# Patient Record
Sex: Female | Born: 1995 | Race: Black or African American | Hispanic: No | Marital: Single | Smoking: Never smoker
Health system: Southern US, Community
[De-identification: ages and names within clinical notes are randomized; demographics above are authoritative.]

## PROBLEM LIST (undated history)

## (undated) DIAGNOSIS — Z789 Other specified health status: Secondary | ICD-10-CM

## (undated) HISTORY — DX: Other specified health status: Z78.9

## (undated) HISTORY — PX: NO PAST SURGERIES: SHX2092

---
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
## 2014-01-04 ENCOUNTER — Other Ambulatory Visit: Payer: Self-pay | Admitting: *Deleted

## 2014-01-04 DIAGNOSIS — R7989 Other specified abnormal findings of blood chemistry: Secondary | ICD-10-CM

## 2014-01-04 DIAGNOSIS — R945 Abnormal results of liver function studies: Principal | ICD-10-CM

## 2014-01-07 ENCOUNTER — Encounter: Payer: Self-pay | Admitting: Family Medicine

## 2014-01-07 ENCOUNTER — Ambulatory Visit (INDEPENDENT_AMBULATORY_CARE_PROVIDER_SITE_OTHER): Admitting: Family Medicine

## 2014-01-07 VITALS — BP 118/60 | HR 68 | Temp 98.2°F | Resp 14 | Ht 64.0 in | Wt 197.0 lb

## 2014-01-07 DIAGNOSIS — E669 Obesity, unspecified: Secondary | ICD-10-CM | POA: Diagnosis not present

## 2014-01-07 DIAGNOSIS — R7989 Other specified abnormal findings of blood chemistry: Secondary | ICD-10-CM

## 2014-01-07 DIAGNOSIS — R945 Abnormal results of liver function studies: Principal | ICD-10-CM

## 2014-01-07 NOTE — Patient Instructions (Signed)
You had 1 liver function that was a little high , nothing of concern at this time Eat healthier , less fast food and fried foods Do not take a lot of tylenol  November 2nd for lab only

## 2014-01-07 NOTE — Progress Notes (Signed)
Patient ID: Hannah Campbell, female   DOB: 04/22/1995, 18 y.o.   MRN: [id number]   Subjective:    Patient ID: Hannah Campbell, female    DOB: 12/23/1995, 18 y.o.   MRN: [id number]  Patient presents for F/U Labs  patient here as she was concerned about her lab results. She was seen for her Depo-Provera injection on October 6 check baseline labs done which showed a minimal elevation in her AST to 64. She's not been taking excessive amounts of Tylenol she denies any drug or alcohol abuse. She is sexually active. She was told to return in 4 weeks for repeat on her liver function but she was confused and came in today    Review Of Systems:  GEN- denies fatigue, fever, weight loss,weakness, recent illness HEENT- denies eye drainage, change in vision, nasal discharge, CVS- denies chest pain, palpitations RESP- denies SOB, cough, wheeze ABD- denies N/V, change in stools, abd pain GU- denies dysuria, hematuria, dribbling, incontinence MSK- denies joint pain, muscle aches, injury Neuro- denies headache, dizziness, syncope, seizure activity       Objective:    BP 118/60  Pulse 68  Temp(Src) 98.2 F (36.8 C) (Oral)  Resp 14  Ht 5\' 4"  (1.626 m)  Wt 197 lb (89.359 kg)  BMI 33.80 kg/m2 GEN- NAD, alert and oriented x3 ABD-NABS,soft,NT,ND, NO hsm        Assessment & Plan:      Problem List Items Addressed This Visit   None    Visit Diagnoses   Elevated LFTs    -  Primary    I discussed lab results with her, discussed reasons for elevated LFT, infection, fatty liver, medications. Avoid tylenol, no ETOH, work on dietary changes,        Note: This dictation was prepared with Nurse, children'sDragon dictation along with smaller Lobbyistphrase technology. Any transcriptional errors that result from this process are unintentional.

## 2014-01-27 ENCOUNTER — Other Ambulatory Visit

## 2014-01-27 DIAGNOSIS — R7989 Other specified abnormal findings of blood chemistry: Secondary | ICD-10-CM

## 2014-01-27 DIAGNOSIS — E669 Obesity, unspecified: Secondary | ICD-10-CM

## 2014-01-27 DIAGNOSIS — R945 Abnormal results of liver function studies: Principal | ICD-10-CM

## 2014-01-28 LAB — HEPATIC FUNCTION PANEL
ALK PHOS: 64 U/L (ref 39–117)
ALT: 12 U/L (ref 0–35)
AST: 47 U/L — AB (ref 0–37)
Albumin: 4.7 g/dL (ref 3.5–5.2)
BILIRUBIN DIRECT: 0.1 mg/dL (ref 0.0–0.3)
BILIRUBIN TOTAL: 0.3 mg/dL (ref 0.2–1.1)
Indirect Bilirubin: 0.2 mg/dL (ref 0.2–1.1)
Total Protein: 7.4 g/dL (ref 6.0–8.3)

## 2014-04-03 ENCOUNTER — Ambulatory Visit (INDEPENDENT_AMBULATORY_CARE_PROVIDER_SITE_OTHER): Payer: Managed Care, Other (non HMO) | Admitting: *Deleted

## 2014-04-03 DIAGNOSIS — Z3040 Encounter for surveillance of contraceptives, unspecified: Secondary | ICD-10-CM | POA: Diagnosis not present

## 2014-04-03 MED ORDER — MEDROXYPROGESTERONE ACETATE 150 MG/ML IM SUSP
150.0000 mg | Freq: Once | INTRAMUSCULAR | Status: AC
  Administered 2014-04-03: 150 mg via INTRAMUSCULAR

## 2014-04-03 NOTE — Progress Notes (Signed)
Patient ID: Hannah Campbell, female   DOB: 11/09/1995, 19 y.o.   MRN: [id number] Patient seen in office for contraception management.   Tolerated IM injection well.

## 2014-05-13 ENCOUNTER — Encounter: Payer: Self-pay | Admitting: Physician Assistant

## 2014-08-13 ENCOUNTER — Telehealth: Payer: Self-pay | Admitting: *Deleted

## 2014-08-13 ENCOUNTER — Encounter: Payer: Self-pay | Admitting: *Deleted

## 2014-08-13 NOTE — Telephone Encounter (Signed)
Received fax from [facility].   Reports that patient received Depo injection on 07/08/2014.

## 2014-10-02 ENCOUNTER — Telehealth: Payer: Self-pay | Admitting: Physician Assistant

## 2014-10-02 ENCOUNTER — Encounter: Payer: Managed Care, Other (non HMO) | Admitting: Physician Assistant

## 2014-10-02 NOTE — Telephone Encounter (Signed)
Cancel.

## 2014-10-06 ENCOUNTER — Ambulatory Visit (INDEPENDENT_AMBULATORY_CARE_PROVIDER_SITE_OTHER): Payer: Managed Care, Other (non HMO) | Admitting: Physician Assistant

## 2014-10-06 ENCOUNTER — Encounter: Payer: Self-pay | Admitting: Physician Assistant

## 2014-10-06 ENCOUNTER — Other Ambulatory Visit: Payer: Self-pay | Admitting: Family Medicine

## 2014-10-06 VITALS — BP 112/78 | HR 80 | Temp 98.1°F | Resp 20 | Wt 204.0 lb

## 2014-10-06 DIAGNOSIS — Z308 Encounter for other contraceptive management: Secondary | ICD-10-CM | POA: Diagnosis not present

## 2014-10-06 DIAGNOSIS — Z7251 High risk heterosexual behavior: Secondary | ICD-10-CM

## 2014-10-06 DIAGNOSIS — Z0000 Encounter for general adult medical examination without abnormal findings: Secondary | ICD-10-CM | POA: Diagnosis not present

## 2014-10-06 DIAGNOSIS — Z3042 Encounter for surveillance of injectable contraceptive: Secondary | ICD-10-CM | POA: Diagnosis not present

## 2014-10-06 DIAGNOSIS — Z309 Encounter for contraceptive management, unspecified: Secondary | ICD-10-CM | POA: Insufficient documentation

## 2014-10-06 MED ORDER — MEDROXYPROGESTERONE ACETATE 150 MG/ML IM SUSP
150.0000 mg | Freq: Once | INTRAMUSCULAR | Status: AC
Start: 2014-10-06 — End: 2014-10-06
  Administered 2014-10-06: 150 mg via INTRAMUSCULAR

## 2014-10-06 MED ORDER — MEDROXYPROGESTERONE ACETATE 150 MG/ML IM SUSP: 150.0000 mg | INTRAMUSCULAR | Status: DC

## 2014-10-06 NOTE — Progress Notes (Addendum)
Patient ID: Hannah Campbell MRN: [id number], DOB: 10/16/1995, 19 y.o. Date of Encounter: 10/06/2014,   Chief Complaint: Physical (CPE)  HPI: 19 y.o. y/o AA female  here for CPE.   Says she started college in [place] "Spring Semester last year". Will be returning to [place] for college soon.  Says she has thought over what Dr. Tanya NonesPickard discussed with her about adverse effects on being on DepoProvera long time.  Says she wants to get Implanon--but will do this after she goes back to [place] (going back there for school soon). Says she wants to get one more injection to hold her over until then. (Has med with her today from pharmacy so she can get injection today) Last injection was given at [facility] in [place] 07/07/2104--There is a nurse note in Epic stating that we did receive Fax of that info.  Says she is here for "physical." Also says she wants to do test test to check for HIV.  Says when she went to clinic in [place], they did test for other STDs but "could not" test for HIV. Says just wants to check HIV today. No other concerns today.   ADDENDUM---ADDED 04/06/2015: Received Records from "[facility]in [place], [place]" 05/21/14--OV--For "Breast Exam"  07/07/2014---Prg Test 07/08/14--Depo Injection 12/09/14--"Headaches, Chest Pain, Stomach Pain---all of her pains tended to begin after starting Depo shots. She says she has already read up on all the side effects and she has all of them. She has no memory of any of these complaints prior to starting the Depo injections. She is sdistressed b/c she has gained 30 lbs since starting Depo. Her stomach is bothered by spicy foods, acidic fruits, and beverages. " Lab: for H.Pylori--Negative Started on Ranitidine  However, she continued Depo Injections Next OV--12/31/14--Pregnancy Test negative---Depo Given At OV 02/17/15--Dx of Abd Pain, GERD, Obesity, IBS were added--but was continued on Depo Inj  Labs performed there: 02/17/2015: FLP:  Trig  95,  HDL--43  LDL-70                    CMET:  AST high at 71.   ALT high at 60          CBC:   Normal           TSH: Normal 12/10/14 :  H. Pylori: Negative 07/08/14---Gon/Chlam, Trich: Negative  Review of Systems: Consitutional: No fever, chills, fatigue, night sweats, lymphadenopathy. No significant/unexplained weight changes. Eyes: No visual changes, eye redness, or discharge. ENT/Mouth: No ear pain, sore throat, nasal drainage, or sinus pain. Cardiovascular: No chest pressure,heaviness, tightness or squeezing, even with exertion. No increased shortness of breath or dyspnea on exertion.No palpitations, edema, orthopnea, PND. Respiratory: No cough, hemoptysis, SOB, or wheezing. Gastrointestinal: No anorexia, dysphagia, reflux, pain, nausea, vomiting, hematemesis, diarrhea, constipation, BRBPR, or melena. Breast: No mass, nodules, bulging, or retraction. No skin changes or inflammation. No nipple discharge. No lymphadenopathy. Genitourinary: No dysuria, hematuria, incontinence, vaginal discharge, pruritis, burning, abnormal bleeding, or pain. Musculoskeletal: No decreased ROM, No joint pain or swelling. No significant pain in neck, back, or extremities. Skin: No rash, pruritis, or concerning lesions. Neurological: No headache, dizziness, syncope, seizures, tremors, memory loss, coordination problems, or paresthesias. Psychological: No anxiety, depression, hallucinations, SI/HI. Endocrine: No polydipsia, polyphagia, polyuria, or known diabetes.No increased fatigue. No palpitations/rapid heart rate. No significant/unexplained weight change. All other systems were reviewed and are otherwise negative.  History reviewed. No pertinent past medical history.   History reviewed. No pertinent past surgical history.  Home Meds:  Outpatient Prescriptions Prior to Visit  Medication Sig Dispense Refill  . medroxyPROGESTERone (DEPO-PROVERA) 150 MG/ML injection Inject 1 mL (150 mg total) into the muscle  every 3 (three) months. 1 mL 1   No facility-administered medications prior to visit.    Allergies: No Known Allergies  History   Social History  . Marital Status: Single    Spouse Name: N/A  . Number of Children: N/A  . Years of Education: N/A   Occupational History  . Not on file.   Social History Main Topics  . Smoking status: Never Smoker   . Smokeless tobacco: Not on file  . Alcohol Use: No  . Drug Use: No  . Sexual Activity: Not on file   Other Topics Concern  . Not on file   Social History Narrative    History reviewed. No pertinent family history.  Physical Exam: Blood pressure 112/78, pulse 80, temperature 98.1 F (36.7 C), temperature source Oral, resp. rate 20, weight 204 lb (92.534 kg)., Body mass index is 35 kg/(m^2). General: Overweight AAF. Appears in no acute distress. HEENT: Normocephalic, atraumatic. Conjunctiva pink, sclera non-icteric. Pupils 2 mm constricting to 1 mm, round, regular, and equally reactive to light and accomodation. EOMI. Internal auditory canal clear. TMs with good cone of light and without pathology. Nasal mucosa pink. Nares are without discharge. No sinus tenderness. Oral mucosa pink.  Pharynx without exudate.   Neck: Supple. Trachea midline. No thyromegaly. Full ROM. No lymphadenopathy.No Carotid Bruits. Lungs: Clear to auscultation bilaterally without wheezes, rales, or rhonchi. Breathing is of normal effort and unlabored. Cardiovascular: RRR with S1 S2. No murmurs, rubs, or gallops. Distal pulses 2+ symmetrically. No carotid or abdominal bruits. Breast: Symmetrical. No masses. Nipples without discharge. Abdomen: Soft, non-tender, non-distended with normoactive bowel sounds. No hepatosplenomegaly or masses. No rebound/guarding. No CVA tenderness. No hernias.  Genitourinary:  External genitalia without lesions. Vaginal mucosa pink.No discharge present. Cervix pink and without discharge. No cervical tenderness.Normal uterus size. No  adnexal mass or tenderness.   Musculoskeletal: Full range of motion and 5/5 strength throughout. Skin: Warm and moist without erythema, ecchymosis, wounds, or rash. Neuro: A+Ox3. CN II-XII grossly intact. Moves all extremities spontaneously. Full sensation throughout. Normal gait. DTR 2+ throughout upper and lower extremities.  Psych:  Responds to questions appropriately with a normal affect.   Assessment/Plan:  19 y.o. y/o female here for CPE  1. Visit for preventive health examination  A. Screening Labs: She had CBC, CMET 12/2013. Had F/U regarding LFTs.  Not fasting today.  Does not need lab today. (except she requested HIV at end of visit)  B. Pap: Guidelines state to wait until age 61 to start Pap Pelvic Exam normal today.    C. Immunizations:  Tetanus UTD--Last 11/16/2011 She has had HPV vaccine Has had Hep B vaccine. Not sure if she has had meningicoccal vaccine--this is her first CPE here--she has been receiving these elsewhere--   2. Encounter for surveillance of injectable contraceptive Give Depo Injection today.  See HPI---Pt plans to f/u with Gyn in [place] for Implanon Reviewed OV note with Dr. Tanya Nones 12/2013 regarding risks associated with continuing DepoProvera > 2-3 years. Pt and I discussed these again today.   3. High risk sexual behavior Discussed "safe sex" practices to reduces risk of STDs - HIV antibody   ADDENDUM---ADDED 04/06/2015: Received Records from "[facility] [place], [place]" 05/21/14--OV--For "Breast Exam"  07/07/2014---Prg Test 07/08/14--Depo Injection 12/09/14--"Headaches, Chest Pain, Stomach Pain---all of her pains tended  to begin after starting Depo shots. She says she has already read up on all the side effects and she has all of them. She has no memory of any of these complaints prior to starting the Depo injections. She is sdistressed b/c she has gained 30 lbs since starting Depo. Her stomach is bothered by spicy foods, acidic fruits,  and beverages. " Lab: for H.Pylori--Negative Started on Ranitidine  However, she continued Depo Injections Next OV--12/31/14--Pregnancy Test negative---Depo Given At OV 02/17/15--Dx of Abd Pain, GERD, Obesity, IBS were added--but was continued on Depo Inj  Labs performed there: 02/17/2015: FLP:  Trig 95,  HDL--43  LDL-70                    CMET:  AST high at 71.   ALT high at 60          CBC:   Normal           TSH: Normal 12/10/14 :  H. Pylori: Negative 07/08/14---Gon/Chlam, Trich: Negative   Signed, [address] [place], [place], [facility] 10/06/2014 12:36 PM

## 2014-10-07 LAB — HIV ANTIBODY (ROUTINE TESTING W REFLEX): HIV 1&2 Ab, 4th Generation: NONREACTIVE

## 2015-03-31 ENCOUNTER — Other Ambulatory Visit: Payer: Self-pay | Admitting: Family Medicine

## 2015-03-31 MED ORDER — MEDROXYPROGESTERONE ACETATE 150 MG/ML IM SUSP: 150.0000 mg | INTRAMUSCULAR | Status: DC

## 2015-03-31 NOTE — Telephone Encounter (Signed)
Medication refilled per protocol. 

## 2015-04-01 ENCOUNTER — Ambulatory Visit (INDEPENDENT_AMBULATORY_CARE_PROVIDER_SITE_OTHER): Payer: Managed Care, Other (non HMO) | Admitting: *Deleted

## 2015-04-01 DIAGNOSIS — Z308 Encounter for other contraceptive management: Secondary | ICD-10-CM

## 2015-04-01 MED ORDER — MEDROXYPROGESTERONE ACETATE 150 MG/ML IM SUSP
150.0000 mg | Freq: Once | INTRAMUSCULAR | Status: AC
  Administered 2015-04-01: 150 mg via INTRAMUSCULAR

## 2015-08-17 ENCOUNTER — Encounter: Payer: Self-pay | Admitting: Physician Assistant

## 2015-08-17 ENCOUNTER — Ambulatory Visit (INDEPENDENT_AMBULATORY_CARE_PROVIDER_SITE_OTHER): Payer: Managed Care, Other (non HMO) | Admitting: Physician Assistant

## 2015-08-17 VITALS — BP 132/76 | HR 68 | Temp 98.2°F | Resp 18 | Wt 218.0 lb

## 2015-08-17 DIAGNOSIS — Z30011 Encounter for initial prescription of contraceptive pills: Secondary | ICD-10-CM

## 2015-08-17 DIAGNOSIS — A499 Bacterial infection, unspecified: Secondary | ICD-10-CM | POA: Diagnosis not present

## 2015-08-17 DIAGNOSIS — N760 Acute vaginitis: Secondary | ICD-10-CM

## 2015-08-17 DIAGNOSIS — B9689 Other specified bacterial agents as the cause of diseases classified elsewhere: Secondary | ICD-10-CM

## 2015-08-17 LAB — WET PREP FOR TRICH, YEAST, CLUE
Trich, Wet Prep: NONE SEEN
YEAST WET PREP: NONE SEEN

## 2015-08-17 LAB — PREGNANCY, URINE: Preg Test, Ur: NEGATIVE

## 2015-08-17 MED ORDER — NORGESTREL-ETHINYL ESTRADIOL 0.3-30 MG-MCG PO TABS: 1.0000 | ORAL_TABLET | Freq: Every day | ORAL | Status: DC

## 2015-08-17 MED ORDER — METRONIDAZOLE 500 MG PO TABS: 500.0000 mg | ORAL_TABLET | Freq: Two times a day (BID) | ORAL | Status: DC

## 2015-08-17 NOTE — Progress Notes (Signed)
Patient ID: Hannah Campbell MRN: [id number], DOB: [birth year], 20 y.o. Date of Encounter: 08/17/2015,   Chief Complaint: Discuss Birth Control  HPI: 20 y.o. y/o AA female   10/06/2014:  here for CPE.   Says she started college in [place] "Spring Semester last year". Will be returning to [place] for college soon.  Says she has thought over what Dr. Tanya Nones discussed with her about adverse effects on being on DepoProvera long time.  Says she wants to get Implanon--but will do this after she goes back to [place] (going back there for school soon). Says she wants to get one more injection to hold her over until then. (Has med with her today from pharmacy so she can get injection today) Last injection was given at [facility] office in [place] 07/07/2104--There is a nurse note in Epic stating that we did receive Fax of that info.  Says she is here for "physical." Also says she wants to do test test to check for HIV.  Says when she went to clinic in [place], they did test for other STDs but "could not" test for HIV. Says just wants to check HIV today. No other concerns today.   ADDENDUM---ADDED 04/06/2015: Received Records from "[facility] [place], [place]" 05/21/14--OV--For "Breast Exam"  07/07/2014---Prg Test 07/08/14--Depo Injection 12/09/14--"Headaches, Chest Pain, Stomach Pain---all of her pains tended to begin after starting Depo shots. She says she has already read up on all the side effects and she has all of them. She has no memory of any of these complaints prior to starting the Depo injections. She is sdistressed b/c she has gained 30 lbs since starting Depo. Her stomach is bothered by spicy foods, acidic fruits, and beverages. " Lab: for H.Pylori--Negative Started on Ranitidine  However, she continued Depo Injections Next OV--12/31/14--Pregnancy Test negative---Depo Given At OV 02/17/15--Dx of Abd Pain, GERD, Obesity, IBS were added--but was continued on Depo Inj  Labs performed  there: 02/17/2015: FLP:  Trig 95,  HDL--43  LDL-70                    CMET:  AST high at 71.   ALT high at 60          CBC:   Normal           TSH: Normal 12/10/14 :  H. Pylori: Negative 07/08/14---Gon/Chlam, Trich: Negative   08/17/2015: Today pt says that since LOV, she continued Depo shots in [place].  Says that when her shot was due in March, there were issues (there was going to be a delay in getting the medicine, delay in getting appointment) and also she was having "side effects and didn't like the way it made her feel." Therefore, b/c of all of these issues, never got the shot that was due in March. Says that she had Menstrual bleeding most of the month of April.  Wants to change to pill.  Says 2 weeks ago she "self diagnosed herself with yeast infection and used Monistat"---symptoms got better but still some irritation.  No other complaints/concerns today.     Review of Systems: Consitutional: No fever, chills, fatigue, night sweats, lymphadenopathy. No significant/unexplained weight changes. Eyes: No visual changes, eye redness, or discharge. ENT/Mouth: No ear pain, sore throat, nasal drainage, or sinus pain. Cardiovascular: No chest pressure,heaviness, tightness or squeezing, even with exertion. No increased shortness of breath or dyspnea on exertion.No palpitations, edema, orthopnea, PND. Respiratory: No cough, hemoptysis, SOB, or wheezing. Gastrointestinal: No anorexia, dysphagia, reflux, pain,  nausea, vomiting, hematemesis, diarrhea, constipation, BRBPR, or melena. Breast: No mass, nodules, bulging, or retraction. No skin changes or inflammation. No nipple discharge. No lymphadenopathy. Genitourinary: No dysuria, hematuria, incontinence, vaginal discharge, pruritis, burning, abnormal bleeding, or pain. Musculoskeletal: No decreased ROM, No joint pain or swelling. No significant pain in neck, back, or extremities. Skin: No rash, pruritis, or concerning lesions. Neurological: No  headache, dizziness, syncope, seizures, tremors, memory loss, coordination problems, or paresthesias. Psychological: No anxiety, depression, hallucinations, SI/HI. Endocrine: No polydipsia, polyphagia, polyuria, or known diabetes.No increased fatigue. No palpitations/rapid heart rate. No significant/unexplained weight change. All other systems were reviewed and are otherwise negative.  No past medical history on file.   No past surgical history on file.  Home Meds:  Outpatient Prescriptions Prior to Visit  Medication Sig Dispense Refill  . medroxyPROGESTERone (DEPO-PROVERA) 150 MG/ML injection Inject 1 mL (150 mg total) into the muscle every 3 (three) months. (Patient not taking: Reported on 08/17/2015) 1 mL 1   No facility-administered medications prior to visit.    Allergies: No Known Allergies  Social History   Social History  . Marital Status: Single    Spouse Name: N/A  . Number of Children: N/A  . Years of Education: N/A   Occupational History  . Not on file.   Social History Main Topics  . Smoking status: Never Smoker   . Smokeless tobacco: Not on file  . Alcohol Use: No  . Drug Use: No  . Sexual Activity: Not on file   Other Topics Concern  . Not on file   Social History Narrative    No family history on file.  Physical Exam: Blood pressure 132/76, pulse 68, temperature 98.2 F (36.8 C), temperature source Oral, resp. rate 18, weight 218 lb (98.884 kg)., Body mass index is 37.4 kg/(m^2). General: Overweight AAF. Appears in no acute distress. Neck: Supple. Trachea midline. No thyromegaly. Full ROM. No lymphadenopathy.No Carotid Bruits. Lungs: Clear to auscultation bilaterally without wheezes, rales, or rhonchi. Breathing is of normal effort and unlabored. Cardiovascular: RRR with S1 S2. No murmurs, rubs, or gallops.  Breast: Symmetrical. No masses. Nipples without discharge. Genitourinary:  External genitalia without lesions. Vaginal mucosa pink. Cervix  normal. Moderate mount of creamy white discharge present. No cervical motion tenderness.Normal uterus size. No adnexal mass or tenderness.   Musculoskeletal: Full range of motion and 5/5 strength throughout. Skin: Warm and moist without erythema, ecchymosis, wounds, or rash. Neuro: A+Ox3. CN II-XII grossly intact. Moves all extremities spontaneously. Full sensation throughout. Normal gait. DTR 2+ throughout upper and lower extremities.  Psych:  Responds to questions appropriately with a normal affect.   Assessment/Plan:  20 y.o. y/o female here for  1. Encounter for initial prescription of contraceptive pills Discussed taking pill every morning. Discussed setting alarm on cell phone as reminder.  - Pregnancy, urine - norgestrel-ethinyl estradiol (LO/OVRAL,CRYSELLE) 0.3-30 MG-MCG tablet; Take 1 tablet by mouth daily.  Dispense: 1 Package; Refill: 11  2. Vaginitis and vulvovaginitis - GC/Chlamydia Probe Amp - WET PREP FOR TRICH, YEAST, CLUE  3. Bacterial vaginosis - metroNIDAZOLE (FLAGYL) 500 MG tablet; Take 1 tablet (500 mg total) by mouth 2 (two) times daily.  Dispense: 14 tablet; Refill: 0 Take med as directed. F/U if symptoms do not resolve after completion of med.  Murray HodgkinsSigned, Mary Beth [place], [place], [facility] 08/17/2015 3:09 PM

## 2015-08-18 LAB — GC/CHLAMYDIA PROBE AMP
CT PROBE, AMP APTIMA: NOT DETECTED
GC Probe RNA: NOT DETECTED

## 2015-10-06 ENCOUNTER — Other Ambulatory Visit: Payer: Self-pay | Admitting: Family Medicine

## 2015-10-06 DIAGNOSIS — Z30011 Encounter for initial prescription of contraceptive pills: Secondary | ICD-10-CM

## 2015-10-06 MED ORDER — NORGESTREL-ETHINYL ESTRADIOL 0.3-30 MG-MCG PO TABS: 1.0000 | ORAL_TABLET | Freq: Every day | ORAL | Status: DC

## 2015-10-06 NOTE — Telephone Encounter (Signed)
Medication refilled per protocol. 

## 2015-10-12 ENCOUNTER — Encounter: Payer: Self-pay | Admitting: Physician Assistant

## 2015-10-12 ENCOUNTER — Ambulatory Visit (INDEPENDENT_AMBULATORY_CARE_PROVIDER_SITE_OTHER): Payer: Managed Care, Other (non HMO) | Admitting: Physician Assistant

## 2015-10-12 ENCOUNTER — Other Ambulatory Visit: Payer: Self-pay | Admitting: Physician Assistant

## 2015-10-12 VITALS — BP 122/78 | Temp 98.0°F | Ht 64.0 in | Wt 219.0 lb

## 2015-10-12 DIAGNOSIS — Z0000 Encounter for general adult medical examination without abnormal findings: Secondary | ICD-10-CM | POA: Diagnosis not present

## 2015-10-12 LAB — LIPID PANEL
CHOL/HDL RATIO: 2.6 ratio (ref <=5.0)
CHOLESTEROL: 145 mg/dL (ref 125–170)
HDL: 56 mg/dL (ref >=46)
LDL Cholesterol: 61 mg/dL (ref <110)
TRIGLYCERIDES: 139 mg/dL (ref <150)
VLDL: 28 mg/dL (ref <30)

## 2015-10-12 LAB — CBC WITH DIFFERENTIAL/PLATELET
BASOS PCT: 0 %
Basophils Absolute: 0 cells/uL (ref 0–200)
EOS ABS: 284 {cells}/uL (ref 15–500)
Eosinophils Relative: 4 %
HEMATOCRIT: 39.9 % (ref 35.0–45.0)
Hemoglobin: 13.2 g/dL (ref 12.0–15.0)
Lymphocytes Relative: 31 %
Lymphs Abs: 2201 cells/uL (ref 850–3900)
MCH: 30.3 pg (ref 27.0–33.0)
MCHC: 33.1 g/dL (ref 32.0–36.0)
MCV: 91.5 fL (ref 80.0–100.0)
MONO ABS: 355 {cells}/uL (ref 200–950)
MPV: 9.3 fL (ref 7.5–12.5)
Monocytes Relative: 5 %
NEUTROS ABS: 4260 {cells}/uL (ref 1500–7800)
Neutrophils Relative %: 60 %
PLATELETS: 319 10*3/uL (ref 140–400)
RBC: 4.36 MIL/uL (ref 3.80–5.10)
RDW: 13.1 % (ref 11.0–15.0)
WBC: 7.1 10*3/uL (ref 3.8–10.8)

## 2015-10-12 LAB — COMPLETE METABOLIC PANEL WITH GFR
ALK PHOS: 69 U/L (ref 33–115)
ALT: 46 U/L — AB (ref 6–29)
AST: 63 U/L — AB (ref 10–30)
Albumin: 4.2 g/dL (ref 3.6–5.1)
BUN: 10 mg/dL (ref 7–25)
CALCIUM: 9.7 mg/dL (ref 8.6–10.2)
CO2: 26 mmol/L (ref 20–31)
Chloride: 105 mmol/L (ref 98–110)
Creat: 0.97 mg/dL (ref 0.50–1.10)
GFR, Est Non African American: 84 mL/min (ref >=60)
Glucose, Bld: 95 mg/dL (ref 70–99)
Potassium: 4.3 mmol/L (ref 3.5–5.3)
Sodium: 138 mmol/L (ref 135–146)
TOTAL PROTEIN: 7.2 g/dL (ref 6.1–8.1)
Total Bilirubin: 0.5 mg/dL (ref 0.2–1.2)

## 2015-10-12 LAB — TSH: TSH: 1.47 mIU/L

## 2015-10-12 NOTE — Progress Notes (Signed)
Patient ID: Hannah Campbell MRN: [id number], DOB: [birth year], 19 y.o. Date of Encounter: 10/12/2015,   Chief Complaint: Physical (CPE)  HPI: 20 y.o. y/o AA female  here for CPE.   10/06/2014: Says she started college in [place] "Spring Semester last year". Will be returning to [place] for college soon.  Says she has thought over what Dr. Tanya Nones discussed with her about adverse effects on being on DepoProvera long time.  Says she wants to get Implanon--but will do this after she goes back to [place] (going back there for school soon). Says she wants to get one more injection to hold her over until then. (Has med with her today from pharmacy so she can get injection today) Last injection was given at [facility] office in [place] 07/07/2104--There is a nurse note in Epic stating that we did receive Fax of that info.  Says she is here for "physical." Also says she wants to do test test to check for HIV.  Says when she went to clinic in [place], they did test for other STDs but "could not" test for HIV. Says just wants to check HIV today. No other concerns today.   ADDENDUM---ADDED 04/06/2015: Received Records from "[facility] [place], [place]" 05/21/14--OV--For "Breast Exam"  07/07/2014---Prg Test 07/08/14--Depo Injection 12/09/14--"Headaches, Chest Pain, Stomach Pain---all of her pains tended to begin after starting Depo shots. She says she has already read up on all the side effects and she has all of them. She has no memory of any of these complaints prior to starting the Depo injections. She is sdistressed b/c she has gained 30 lbs since starting Depo. Her stomach is bothered by spicy foods, acidic fruits, and beverages. " Lab: for H.Pylori--Negative Started on Ranitidine  However, she continued Depo Injections Next OV--12/31/14--Pregnancy Test negative---Depo Given At OV 02/17/15--Dx of Abd Pain, GERD, Obesity, IBS were added--but was continued on Depo Inj  Labs performed there: 02/17/2015:  FLP:  Trig 95,  HDL--43  LDL-70                    CMET:  AST high at 71.   ALT high at 60          CBC:   Normal           TSH: Normal 12/10/14 :  H. Pylori: Negative 07/08/14---Gon/Chlam, Trich: Negative   08/17/2015; She reports that since last visit here,  that she continued Depo shots in [place]. However, when she went to get her injection that was due in March there were issues -- getting the medicine and delaying getting appointment-- and also she was having "side effects and didn't like the way it made her feel ". Therefore because of all of those issues,  never got the injection that was due in March. Reported that she had menstrual bleeding most of the month of April. At visit 08/17/15 was wanting to change to pill. At that visit I prescribed Lo- Ovral and discussed how to properly take the pill.    10/12/2015: She reports that she never started the pill. Says that she was concerned of possible side effects. Never gets any real good explanation of why she did not start it. Says that she has been sexually active. Says that she is here for her physical today. Does want to do the screening labs that are included in the physical. Is fasting today. No other complaints or concerns today.   Review of Systems: Consitutional: No fever, chills, fatigue, night sweats,  lymphadenopathy. No significant/unexplained weight changes. Eyes: No visual changes, eye redness, or discharge. ENT/Mouth: No ear pain, sore throat, nasal drainage, or sinus pain. Cardiovascular: No chest pressure,heaviness, tightness or squeezing, even with exertion. No increased shortness of breath or dyspnea on exertion.No palpitations, edema, orthopnea, PND. Respiratory: No cough, hemoptysis, SOB, or wheezing. Gastrointestinal: No anorexia, dysphagia, reflux, pain, nausea, vomiting, hematemesis, diarrhea, constipation, BRBPR, or melena. Breast: No mass, nodules, bulging, or retraction. No skin changes or inflammation. No nipple  discharge. No lymphadenopathy. Genitourinary: No dysuria, hematuria, incontinence, vaginal discharge, pruritis, burning, abnormal bleeding, or pain. Musculoskeletal: No decreased ROM, No joint pain or swelling. No significant pain in neck, back, or extremities. Skin: No rash, pruritis, or concerning lesions. Neurological: No headache, dizziness, syncope, seizures, tremors, memory loss, coordination problems, or paresthesias. Psychological: No anxiety, depression, hallucinations, SI/HI. Endocrine: No polydipsia, polyphagia, polyuria, or known diabetes.No increased fatigue. No palpitations/rapid heart rate. No significant/unexplained weight change. All other systems were reviewed and are otherwise negative.  No past medical history on file.   No past surgical history on file.  Home Meds:  Outpatient Prescriptions Prior to Visit  Medication Sig Dispense Refill  . medroxyPROGESTERone (DEPO-PROVERA) 150 MG/ML injection Inject 1 mL (150 mg total) into the muscle every 3 (three) months. (Patient not taking: Reported on 08/17/2015) 1 mL 1  . metroNIDAZOLE (FLAGYL) 500 MG tablet Take 1 tablet (500 mg total) by mouth 2 (two) times daily. 14 tablet 0  . norgestrel-ethinyl estradiol (LO/OVRAL,CRYSELLE) 0.3-30 MG-MCG tablet Take 1 tablet by mouth daily. 3 Package 3   No facility-administered medications prior to visit.    Allergies: No Known Allergies  Social History   Social History  . Marital Status: Single    Spouse Name: N/A  . Number of Children: N/A  . Years of Education: N/A   Occupational History  . Not on file.   Social History Main Topics  . Smoking status: Never Smoker   . Smokeless tobacco: Not on file  . Alcohol Use: No  . Drug Use: No  . Sexual Activity: Not on file   Other Topics Concern  . Not on file   Social History Narrative    No family history on file.  Physical Exam: Blood pressure 122/78, temperature 98 F (36.7 C), height  (1.626 m), weight 219 lb  (99.338 kg)., Body mass index is 37.57 kg/(m^2). General: Overweight AAF. Appears in no acute distress. HEENT: Normocephalic, atraumatic. Conjunctiva pink, sclera non-icteric. Pupils 2 mm constricting to 1 mm, round, regular, and equally reactive to light and accomodation. EOMI. Internal auditory canal clear. TMs with good cone of light and without pathology. Nasal mucosa pink. Nares are without discharge. No sinus tenderness. Oral mucosa pink.  Pharynx without exudate.   Neck: Supple. Trachea midline. No thyromegaly. Full ROM. No lymphadenopathy.No Carotid Bruits. Lungs: Clear to auscultation bilaterally without wheezes, rales, or rhonchi. Breathing is of normal effort and unlabored. Cardiovascular: RRR with S1 S2. No murmurs, rubs, or gallops. Distal pulses 2+ symmetrically. No carotid or abdominal bruits. Breast: Deferred today. Just did breast exam 07/2015 Abdomen: Soft, non-tender, non-distended with normoactive bowel sounds. No hepatosplenomegaly or masses. No rebound/guarding. No CVA tenderness. No hernias.  Genitourinary: Deferred today. Just did pelvic exam 07/2015 Musculoskeletal: Full range of motion and 5/5 strength throughout. Skin: Warm and moist without erythema, ecchymosis, wounds, or rash. Neuro: A+Ox3. CN II-XII grossly intact. Moves all extremities spontaneously. Full sensation throughout. Normal gait. DTR 2+ throughout upper and lower extremities.  Psych:  Responds to questions appropriately with a normal affect.   Assessment/Plan:  20 y.o. y/o female here for CPE  1. Visit for preventive health examination  A. Screening Labs: - CBC with Differential/Platelet - COMPLETE METABOLIC PANEL WITH GFR - Lipid panel - TSH  B. Pap: Guidelines state to wait until age 21 to start Pap Pelvic Exam normal 07/2015.    C. Immunizations:  Tetanus UTD--Last 11/16/2011 She has had HPV vaccine Has had Hep B vaccine. Not sure if she has had meningicoccal vaccine--her first CPE here was  09/2104--she had been having CPE and immunizatons elsewhere--  Told her to start using the birth control pill daily. Discussed her concerns of taking it and reassured her that there are no significant adverse effects from taking this. Discussed with her that if she is sexually active she needs to do use some type of contraception unless she wants/plans to become pregnant which she says she does not want/plan right now. Re-itterated that she needs to be taking the birth control pill daily.  [address], Mary Beth [place], [place], [facility] 10/12/2015 12:41 PM

## 2015-10-14 LAB — HEPATITIS PANEL, ACUTE
HCV Ab: NEGATIVE
HEP A IGM: NONREACTIVE
HEP B S AG: NEGATIVE
Hep B C IgM: NONREACTIVE

## 2015-10-15 ENCOUNTER — Telehealth: Payer: Self-pay | Admitting: *Deleted

## 2015-10-15 DIAGNOSIS — R7989 Other specified abnormal findings of blood chemistry: Secondary | ICD-10-CM

## 2015-10-15 DIAGNOSIS — R945 Abnormal results of liver function studies: Principal | ICD-10-CM

## 2015-10-15 NOTE — Telephone Encounter (Signed)
Patient scheduled for fasting labs 7/21

## 2015-10-16 ENCOUNTER — Other Ambulatory Visit: Payer: Managed Care, Other (non HMO)

## 2015-10-20 ENCOUNTER — Other Ambulatory Visit: Payer: Managed Care, Other (non HMO)

## 2015-10-20 ENCOUNTER — Other Ambulatory Visit: Payer: Self-pay | Admitting: Family Medicine

## 2015-10-20 DIAGNOSIS — R7989 Other specified abnormal findings of blood chemistry: Secondary | ICD-10-CM

## 2015-10-20 DIAGNOSIS — R945 Abnormal results of liver function studies: Principal | ICD-10-CM

## 2015-10-20 LAB — IRON AND TIBC
%SAT: 17 % (ref 11–50)
IRON: 51 ug/dL (ref 40–190)
TIBC: 309 ug/dL (ref 250–450)
UIBC: 258 ug/dL (ref 125–400)

## 2015-10-21 LAB — ANA: ANA: NEGATIVE

## 2015-10-22 LAB — CERULOPLASMIN: Ceruloplasmin: 27 mg/dL (ref 18–53)

## 2015-10-23 LAB — ANTI-MICROSOMAL ANTIBODY LIVER / KIDNEY: LKM1 Ab: <=20 U (ref <=20.0)

## 2015-10-27 ENCOUNTER — Other Ambulatory Visit: Payer: Self-pay

## 2015-11-02 ENCOUNTER — Other Ambulatory Visit: Payer: Self-pay

## 2015-11-17 ENCOUNTER — Encounter: Payer: Self-pay | Admitting: Family Medicine

## 2016-04-06 ENCOUNTER — Ambulatory Visit: Payer: Managed Care, Other (non HMO) | Admitting: Physician Assistant

## 2016-04-07 ENCOUNTER — Encounter: Payer: Self-pay | Admitting: Physician Assistant

## 2016-04-07 ENCOUNTER — Ambulatory Visit (INDEPENDENT_AMBULATORY_CARE_PROVIDER_SITE_OTHER): Payer: 59 | Admitting: Physician Assistant

## 2016-04-07 VITALS — BP 110/70 | HR 92 | Temp 98.1°F | Resp 16 | Wt 257.0 lb

## 2016-04-07 DIAGNOSIS — R590 Localized enlarged lymph nodes: Secondary | ICD-10-CM | POA: Diagnosis not present

## 2016-04-07 DIAGNOSIS — M940 Chondrocostal junction syndrome [Tietze]: Secondary | ICD-10-CM | POA: Diagnosis not present

## 2016-04-07 DIAGNOSIS — B9689 Other specified bacterial agents as the cause of diseases classified elsewhere: Secondary | ICD-10-CM | POA: Diagnosis not present

## 2016-04-07 DIAGNOSIS — N760 Acute vaginitis: Secondary | ICD-10-CM

## 2016-04-07 LAB — WET PREP FOR TRICH, YEAST, CLUE
Trich, Wet Prep: NONE SEEN
Yeast Wet Prep HPF POC: NONE SEEN

## 2016-04-07 MED ORDER — METRONIDAZOLE 500 MG PO TABS: 500.0000 mg | ORAL_TABLET | Freq: Three times a day (TID) | ORAL | 0 refills | Status: DC

## 2016-04-07 MED ORDER — MELOXICAM 7.5 MG PO TABS: 7.5000 mg | ORAL_TABLET | Freq: Every day | ORAL | 0 refills | Status: DC

## 2016-04-07 NOTE — Progress Notes (Signed)
Patient ID: Hannah Campbell MRN: [id number], DOB: 08/06/1995, 21 y.o. Date of Encounter: 04/07/2016, 12:52 PM    Chief Complaint:  Chief Complaint  Patient presents with  . lump on lower right abdominal    noticed couple days ago  . Chest Pain    couple months     HPI: 21 y.o. year old female presents with above.  Points to right growing increase about midway up the anterior aspect of that growing increase is an area that she has been feeling some mild discomfort and firmness off and on for a couple weeks. Reports that she has noticed no vaginal discharge, vaginal irritation, pelvic pain. No fevers or chills.  Also reports that she has been feeling chest pain that starts every morning and last all day. Says that she doesn't know if it is because she has gained so much weight or what.  No other concerns to address today.     Home Meds:   No outpatient prescriptions prior to visit.   No facility-administered medications prior to visit.     Allergies: No Known Allergies    Review of Systems: See HPI for pertinent ROS. All other ROS negative.    Physical Exam: Blood pressure 110/70, pulse 92, temperature 98.1 F (36.7 C), temperature source Oral, resp. rate 16, weight 257 lb (116.6 kg), last menstrual period 03/28/2016., Body mass index is 44.11 kg/m. General:  Obese AAF. Appears in no acute distress. Neck: Supple. No thyromegaly. No lymphadenopathy. Lungs: Clear bilaterally to auscultation without wheezes, rales, or rhonchi. Breathing is unlabored. Heart: Regular rhythm. No murmurs, rubs, or gallops. Msk:  Strength and tone normal for age. Chest Wall: Positive tenderness with palpation along peristernal border bilaterally.  Pelvic Exam: Groin crease on Right side---1/2 between pubic region and lateral aspect of groin crease--there is ~ 1 cm area of firmness. I palpate this region this is what patient states that she has been feeling. Causes some mild tenderness with  palpation.  No other masses/lymph nodes palpable. External genitalia normal. Vaginal mucosa normal. Cervix normal. There is small amount of liquidy white discharge present. No lesions present. No ulcers present. No signs of herpes or syphilis. Pubis: Appearance that she has shaved hair here. Prominent hair follicles but no focal active folliculitis at present. Extremities/Skin: Warm and dry.  Neuro: Alert and oriented X 3. Moves all extremities spontaneously. Gait is normal. CNII-XII grossly in tact. Psych:  Responds to questions appropriately with a normal affect.   Results for orders placed or performed in visit on 04/07/16  WET PREP FOR TRICH, YEAST, CLUE  Result Value Ref Range   Yeast Wet Prep HPF POC NONE SEEN NONE SEEN   Trich, Wet Prep NONE SEEN NONE SEEN   Clue Cells Wet Prep HPF POC FEW (A) NONE SEEN   WBC, Wet Prep HPF POC FEW NONE SEEN     ASSESSMENT AND PLAN:  21 y.o. year old female with  1. Lymphadenopathy, inguinal Explained to her that everyone has lymph nodes in different areas of the body including this area. Discussed with her that lymph nodes will get enlarged and tender if there is inflammation or infection. Will check for STD source of infection that could be causing this. If this is negative then lymph node may just be secondary to inflammation secondary to shaving there. Wet prep results review today. Will follow-up GC results. If this is negative then may just be secondary to some mild folliculitis. If lymphadenopathy worsens or develops additional  symptoms and follow-up. - GC/Chlamydia Probe Amp - WET PREP FOR TRICH, YEAST, CLUE  2. BV (bacterial vaginosis) She is to take Flagyl as directed. If lymphadenopathy persists/worsens in follow-up. - metroNIDAZOLE (FLAGYL) 500 MG tablet; Take 1 tablet (500 mg total) by mouth 3 (three) times daily.  Dispense: 14 tablet; Refill: 0  3. Costochondritis She is to take meloxicam with food. - meloxicam (MOBIC) 7.5 MG  tablet; Take 1 tablet (7.5 mg total) by mouth daily.  Dispense: 30 tablet; Refill: 0   Signed, [address] [place], [place], [facility] 04/07/2016 12:52 PM

## 2016-04-08 LAB — GC/CHLAMYDIA PROBE AMP
CT PROBE, AMP APTIMA: NOT DETECTED
GC PROBE AMP APTIMA: NOT DETECTED

## 2016-06-08 ENCOUNTER — Encounter: Payer: Self-pay | Admitting: Physician Assistant

## 2016-06-08 ENCOUNTER — Ambulatory Visit (INDEPENDENT_AMBULATORY_CARE_PROVIDER_SITE_OTHER): Payer: 59 | Admitting: Physician Assistant

## 2016-06-08 VITALS — BP 130/99 | HR 80 | Temp 98.2°F | Resp 16 | Wt 256.8 lb

## 2016-06-08 DIAGNOSIS — R0789 Other chest pain: Secondary | ICD-10-CM | POA: Diagnosis not present

## 2016-06-08 NOTE — Progress Notes (Signed)
    Patient ID: Hannah Campbell MRN: [id number], DOB: 03/24/1996, 21 y.o. Date of Encounter: 06/08/2016, 2:33 PM    Chief Complaint:  Chief Complaint  Patient presents with  . Chest Pain    x1 year     HPI: 21 y.o. year old female presents with above.   Sayss that she has been feeling this chest discomfort off and on for about a year now.  Says that she was diagnosed with GERD over the past year. Asked what symptoms she was having when she was given that diagnosis and she says that she was having burning sensation in her chest.  Says that now she still sometimes has burning sensation in her chest but also at other times has sharp pain. Says that she will feel it when she first wakes up in the morning when lying in bed.   Says that she has family history of high blood pressure and diabetes and that she knows that she has gained a lot of weight so she wanted to make sure she wasn't developing any of those problems or problems with her heart. Says that she is going try to at least get back down to her high school weight of 188. Realizes that she has gained up to 256.    Home Meds:   Outpatient Medications Prior to Visit  Medication Sig Dispense Refill  . meloxicam (MOBIC) 7.5 MG tablet Take 1 tablet (7.5 mg total) by mouth daily. 30 tablet 0  . metroNIDAZOLE (FLAGYL) 500 MG tablet Take 1 tablet (500 mg total) by mouth 3 (three) times daily. 14 tablet 0   No facility-administered medications prior to visit.     Allergies: No Known Allergies    Review of Systems: See HPI for pertinent ROS. All other ROS negative.    Physical Exam: Blood pressure (!) 130/ 84, pulse 80, temperature 98.2 F (36.8 C), temperature source Oral, resp. rate 16, weight 256 lb 12.8 oz (116.5 kg), last menstrual period 05/24/2016., Body mass index is 44.08 kg/m. General:  Obese AAF. Appears in no acute distress. Neck: Supple. No thyromegaly. No lymphadenopathy. Lungs: Clear bilaterally to auscultation without  wheezes, rales, or rhonchi. Breathing is unlabored. Heart: Regular rhythm. No murmurs, rubs, or gallops. Msk:  Strength and tone normal for age. There is tenderness with palpation of left chest wall/ peristernal region Extremities/Skin: Warm and dry.  Neuro: Alert and oriented X 3. Moves all extremities spontaneously. Gait is normal. CNII-XII grossly in tact. Psych:  Responds to questions appropriately with a normal affect.     ASSESSMENT AND PLAN:  21 y.o. year old female with    1. Musculoskeletal chest pain Reassured her that her symptoms sound musculoskeletal in nature. Also discussed that she has large breasts and these may be pulling on the chest wall producing/contributing to pain there. Discussed that if she is concerned of developing diabetes hypertension and heart disease, that the best thing she can do is improve her diet and exercise and lose weight and reduce her risk   Signed, [address] CourtMary Beth [place], [place], [facility] 06/08/2016 2:33 PM

## 2016-07-19 ENCOUNTER — Encounter: Payer: Self-pay | Admitting: Physician Assistant

## 2016-08-19 ENCOUNTER — Encounter: Payer: Self-pay | Admitting: Physician Assistant

## 2016-08-29 ENCOUNTER — Encounter (HOSPITAL_COMMUNITY): Payer: Self-pay | Admitting: Emergency Medicine

## 2016-08-29 ENCOUNTER — Emergency Department (HOSPITAL_COMMUNITY)
Admission: EM | Admit: 2016-08-29 | Discharge: 2016-08-29 | Disposition: A | Discharge status: Home / Self Care | Payer: 59 | Attending: Emergency Medicine | Admitting: Emergency Medicine

## 2016-08-29 ENCOUNTER — Emergency Department (HOSPITAL_COMMUNITY): Payer: 59

## 2016-08-29 DIAGNOSIS — R0789 Other chest pain: Secondary | ICD-10-CM | POA: Insufficient documentation

## 2016-08-29 LAB — CBC
HEMATOCRIT: 38.4 % (ref 36.0–46.0)
Hemoglobin: 13.2 g/dL (ref 12.0–15.0)
MCH: 30.3 pg (ref 26.0–34.0)
MCHC: 34.4 g/dL (ref 30.0–36.0)
MCV: 88.3 fL (ref 78.0–100.0)
PLATELETS: 370 10*3/uL (ref 150–400)
RBC: 4.35 MIL/uL (ref 3.87–5.11)
RDW: 12.1 % (ref 11.5–15.5)
WBC: 9.5 10*3/uL (ref 4.0–10.5)

## 2016-08-29 LAB — BASIC METABOLIC PANEL
Anion gap: 8 (ref 5–15)
BUN: 7 mg/dL (ref 6–20)
CALCIUM: 9.7 mg/dL (ref 8.9–10.3)
CO2: 23 mmol/L (ref 22–32)
Chloride: 106 mmol/L (ref 101–111)
Creatinine, Ser: 0.79 mg/dL (ref 0.44–1.00)
GFR calc Af Amer: >60 mL/min (ref >60)
GLUCOSE: 95 mg/dL (ref 65–99)
POTASSIUM: 3.7 mmol/L (ref 3.5–5.1)
SODIUM: 137 mmol/L (ref 135–145)

## 2016-08-29 LAB — I-STAT TROPONIN, ED: Troponin i, poc: 0.01 ng/mL (ref 0.00–0.08)

## 2016-08-29 NOTE — ED Triage Notes (Signed)
Pt reports she has been experiencing substernal chest pain for over a year but today it has been constant.  She also reports headaches.  Pt denies any n/v but reports SOB

## 2016-08-29 NOTE — Discharge Instructions (Signed)
As discussed please take your Modic as directed for your chest discomfort.  As far as your exercising decrease the intensity of your workouts by lightening the weights for the next several weeks and then gradually increase rather than have bursts of intense exercise

## 2016-08-29 NOTE — ED Provider Notes (Signed)
[facility]-EMERGENCY DEPT Provider Note   CSN: [id number] Arrival date & time: 08/29/16  0046     History   Chief Complaint Chief Complaint  Patient presents with  . Chest Pain    HPI Hannah Campbell is a 21 y.o. female.  This is an obese 21 year old female who reports with central chest discomfort that she's had for over a year.  She states it waxes and wanes in intensity.  She's never been diagnosed with any specific illness or condition.  She's been taking Motrin for discomfort intermittently.  Has not taken any in the last several days.  Denies any fever, cough, shortness of breath, diaphoresis, nausea. She does state that she's resumed a vigorous exercise regime.      History reviewed. No pertinent past medical history.  Patient Active Problem List   Diagnosis Date Noted  . Contraception management 10/06/2014    History reviewed. No pertinent surgical history.  OB History    No data available       Home Medications    Prior to Admission medications   Medication Sig Start Date End Date Taking? Authorizing Provider  acetaminophen (TYLENOL) 325 MG tablet Take 650 mg by mouth every 6 (six) hours as needed for mild pain.   Yes [provider]  meloxicam (MOBIC) 7.5 MG tablet Take 1 tablet (7.5 mg total) by mouth daily. Patient not taking: Reported on 08/29/2016 04/07/16   Dorena Bodo, PA-C    Family History No family history on file.  Social History Social History  Substance Use Topics  . Smoking status: Never Smoker  . Smokeless tobacco: Never Used  . Alcohol use No     Allergies   Patient has no known allergies.   Review of Systems Review of Systems  Constitutional: Negative for fever.  HENT: Negative for congestion.   Respiratory: Negative for cough and shortness of breath.   Cardiovascular: Positive for chest pain. Negative for palpitations.  Neurological: Negative for dizziness.  All other systems reviewed and are negative.    Physical  Exam Updated Vital Signs BP 125/76 (BP Location: Right Arm)   Pulse 83   Temp 98.4 F (36.9 C) (Oral)   Resp 17   LMP 08/15/2016 (Approximate)   SpO2 100%   Physical Exam  Constitutional: She appears well-developed and well-nourished. No distress.  HENT:  Head: Normocephalic.  Nose: Nose normal.  Neck: Normal range of motion.  Cardiovascular: Normal rate, regular rhythm and normal heart sounds.   Pulmonary/Chest: Effort normal. She exhibits no tenderness.  Abdominal: Soft.  Neurological: She is alert.  Skin: Skin is warm.  Psychiatric: She has a normal mood and affect.  Nursing note and vitals reviewed.    ED Treatments / Results  Labs (all labs ordered are listed, but only abnormal results are displayed) Labs Reviewed  BASIC METABOLIC PANEL  CBC  I-STAT TROPOININ, ED    EKG  EKG Interpretation None       Radiology Dg Chest 2 View  Result Date: 08/29/2016 CLINICAL DATA:  Mid chest pain and chest tightness x2 days with cough and congestion. EXAM: CHEST  2 VIEW COMPARISON:  None. FINDINGS: The heart size and mediastinal contours are within normal limits. Both lungs are clear. The visualized skeletal structures are unremarkable. Hair artifacts are seen overlying both shoulders. IMPRESSION: No active cardiopulmonary disease. Electronically Signed   By: Tollie Eth M.D.   On: 08/29/2016 01:58    Procedures Procedures (including critical care time)  Medications Ordered in  ED Medications - No data to display   Initial Impression / Assessment and Plan / ED Course  I have reviewed the triage vital signs and the nursing notes.  Pertinent labs & imaging results that were available during my care of the patient were reviewed by me and considered in my medical decision making (see chart for details).      Patient has been instructed to take her aerobic on a regular basis to decrease the intensity of her exercise regime and follow-up with her PCP as needed.  Final  Clinical Impressions(s) / ED Diagnoses   Final diagnoses:  Chest wall pain    New Prescriptions New Prescriptions   No medications on file     Earley FavorSchulz, Archita Lomeli, NP 08/29/16 0301    Shon BatonHorton, Courtney F, MD 08/29/16 [phone]

## 2016-10-13 ENCOUNTER — Encounter: Payer: Self-pay | Admitting: Physician Assistant

## 2017-10-02 DIAGNOSIS — N946 Dysmenorrhea, unspecified: Secondary | ICD-10-CM | POA: Insufficient documentation

## 2017-10-02 DIAGNOSIS — Z789 Other specified health status: Secondary | ICD-10-CM | POA: Insufficient documentation

## 2018-03-28 NOTE — L&D Delivery Note (Signed)
OB/GYN Faculty Practice Delivery Note  Hannah Campbell is a 22 y.o. G1P0 s/p SVD at [redacted]w[redacted]d. She was admitted for IOL secondary to post-dates.   ROM: 11h 46m with clear fluid GBS Status:  Negative (07/27 0000) Maximum Maternal Temperature: 99.54F  Labor Progress: . Patient presented to L&D for IOL for post-dates. Initial SVE: 1.5/50%/-3. Patient received Cytotec, Foley Bulb, Pitocin, AROM. Epidural was placed. She then progressed to complete.   Delivery Date/Time: 9/1 @ 0153 Delivery: Called to room and patient was complete and pushing. Head delivered in ROA position. Nuchal cord present and easily reduced. Shoulder and body delivered in usual fashion. Infant with spontaneous cry, placed on mother's abdomen, dried and stimulated. Cord clamped x 2 after 1-minute delay, and cut by FOB. Cord blood drawn. Placenta delivered spontaneously with gentle cord traction. Fundus firm with massage and Pitocin. Labia, perineum, vagina, and cervix inspected inspected with first degree vaginal laceration which was repaired with 3-0 Vicryl in a standard fashion.  Baby Weight: pending  Placenta: Mother would like to keep placenta Complications: None Lacerations: First degree vaginal EBL: 468 mL Analgesia: Epidural    Postpartum Planning [ ]  message to sent to schedule follow-up  [ ]  vaccines UTD  Infant: APGAR (1 MIN): 7   APGAR (5 MINS): 9   APGAR (10 MINS):     Barrington Ellison, MD [facility] Fellow, [facility] for [facility], [facility] 11/27/2018, 2:25 AM

## 2018-06-04 LAB — OB RESULTS CONSOLE GC/CHLAMYDIA
Chlamydia: NEGATIVE
Gonorrhea: NEGATIVE

## 2018-06-04 LAB — OB RESULTS CONSOLE ABO/RH: RH Type: POSITIVE

## 2018-06-04 LAB — OB RESULTS CONSOLE HIV ANTIBODY (ROUTINE TESTING): HIV: NONREACTIVE

## 2018-06-04 LAB — OB RESULTS CONSOLE HEPATITIS B SURFACE ANTIGEN: Hepatitis B Surface Ag: NEGATIVE

## 2018-06-04 LAB — OB RESULTS CONSOLE RUBELLA ANTIBODY, IGM: Rubella: IMMUNE

## 2018-06-04 LAB — OB RESULTS CONSOLE RPR: RPR: NONREACTIVE

## 2018-06-04 LAB — OB RESULTS CONSOLE ANTIBODY SCREEN: Antibody Screen: NEGATIVE

## 2018-10-22 LAB — OB RESULTS CONSOLE GBS: GBS: NEGATIVE

## 2018-10-22 LAB — OB RESULTS CONSOLE GC/CHLAMYDIA
Chlamydia: NEGATIVE
Gonorrhea: NEGATIVE

## 2018-11-20 ENCOUNTER — Telehealth (HOSPITAL_COMMUNITY): Payer: Self-pay | Admitting: *Deleted

## 2018-11-20 ENCOUNTER — Encounter (HOSPITAL_COMMUNITY): Payer: Self-pay | Admitting: *Deleted

## 2018-11-20 ENCOUNTER — Other Ambulatory Visit: Payer: Self-pay | Admitting: Advanced Practice Midwife

## 2018-11-20 ENCOUNTER — Other Ambulatory Visit (HOSPITAL_COMMUNITY): Payer: Self-pay | Admitting: Nurse Practitioner

## 2018-11-20 DIAGNOSIS — O480 Post-term pregnancy: Secondary | ICD-10-CM

## 2018-11-20 NOTE — Telephone Encounter (Signed)
Preadmission screen  

## 2018-11-21 ENCOUNTER — Other Ambulatory Visit: Payer: Self-pay

## 2018-11-21 ENCOUNTER — Ambulatory Visit (HOSPITAL_COMMUNITY)
Admission: RE | Admit: 2018-11-21 | Discharge: 2018-11-21 | Disposition: A | Discharge status: Home / Self Care | Payer: 59 | Source: Ambulatory Visit | Attending: Obstetrics and Gynecology | Admitting: Obstetrics and Gynecology

## 2018-11-21 DIAGNOSIS — O480 Post-term pregnancy: Secondary | ICD-10-CM | POA: Diagnosis present

## 2018-11-21 DIAGNOSIS — Z3A40 40 weeks gestation of pregnancy: Secondary | ICD-10-CM

## 2018-11-21 DIAGNOSIS — O99213 Obesity complicating pregnancy, third trimester: Secondary | ICD-10-CM | POA: Diagnosis not present

## 2018-11-21 DIAGNOSIS — Z369 Encounter for antenatal screening, unspecified: Secondary | ICD-10-CM

## 2018-11-24 ENCOUNTER — Other Ambulatory Visit (HOSPITAL_COMMUNITY)
Admission: RE | Admit: 2018-11-24 | Discharge: 2018-11-24 | Disposition: A | Discharge status: Home / Self Care | Payer: 59 | Source: Ambulatory Visit | Attending: Obstetrics and Gynecology | Admitting: Obstetrics and Gynecology

## 2018-11-24 ENCOUNTER — Other Ambulatory Visit: Payer: Self-pay

## 2018-11-24 DIAGNOSIS — Z20828 Contact with and (suspected) exposure to other viral communicable diseases: Secondary | ICD-10-CM | POA: Insufficient documentation

## 2018-11-24 DIAGNOSIS — Z01812 Encounter for preprocedural laboratory examination: Secondary | ICD-10-CM | POA: Insufficient documentation

## 2018-11-24 LAB — SARS CORONAVIRUS 2 (TAT 6-24 HRS): SARS Coronavirus 2: NEGATIVE

## 2018-11-24 NOTE — MAU Note (Signed)
Pt here for PAT covid swab, denies any symptoms. Swab collected 

## 2018-11-26 ENCOUNTER — Inpatient Hospital Stay (HOSPITAL_COMMUNITY): Payer: No Typology Code available for payment source | Admitting: Anesthesiology

## 2018-11-26 ENCOUNTER — Inpatient Hospital Stay (HOSPITAL_COMMUNITY): Payer: No Typology Code available for payment source

## 2018-11-26 ENCOUNTER — Inpatient Hospital Stay (HOSPITAL_COMMUNITY)
Admission: AD | Admit: 2018-11-26 | Discharge: 2018-11-29 | DRG: 807 | Disposition: A | Discharge status: Home / Self Care | Payer: No Typology Code available for payment source | Attending: Obstetrics and Gynecology | Admitting: Obstetrics and Gynecology

## 2018-11-26 ENCOUNTER — Other Ambulatory Visit: Payer: Self-pay

## 2018-11-26 ENCOUNTER — Encounter (HOSPITAL_COMMUNITY): Payer: Self-pay

## 2018-11-26 DIAGNOSIS — O480 Post-term pregnancy: Principal | ICD-10-CM | POA: Diagnosis present

## 2018-11-26 DIAGNOSIS — Z3A41 41 weeks gestation of pregnancy: Secondary | ICD-10-CM

## 2018-11-26 LAB — CBC
HCT: 40.5 % (ref 36.0–46.0)
Hemoglobin: 13.8 g/dL (ref 12.0–15.0)
MCH: 31.2 pg (ref 26.0–34.0)
MCHC: 34.1 g/dL (ref 30.0–36.0)
MCV: 91.6 fL (ref 80.0–100.0)
Platelets: 179 10*3/uL (ref 150–400)
RBC: 4.42 MIL/uL (ref 3.87–5.11)
RDW: 13.3 % (ref 11.5–15.5)
WBC: 7.9 10*3/uL (ref 4.0–10.5)
nRBC: 0 % (ref 0.0–0.2)

## 2018-11-26 LAB — ABO/RH: ABO/RH(D): A POS

## 2018-11-26 LAB — TYPE AND SCREEN
ABO/RH(D): A POS
Antibody Screen: NEGATIVE

## 2018-11-26 LAB — SYPHILIS: RPR W/REFLEX TO RPR TITER AND TREPONEMAL ANTIBODIES, TRADITIONAL SCREENING AND DIAGNOSIS ALGORITHM: RPR Ser Ql: NONREACTIVE

## 2018-11-26 MED ORDER — LIDOCAINE HCL (PF) 1 % IJ SOLN: 30.0000 mL | INTRAMUSCULAR | Status: DC | PRN

## 2018-11-26 MED ORDER — TERBUTALINE SULFATE 1 MG/ML IJ SOLN: 0.2500 mg | Freq: Once | INTRAMUSCULAR | Status: DC | PRN

## 2018-11-26 MED ORDER — EPHEDRINE 5 MG/ML INJ: 10.0000 mg | INTRAVENOUS | Status: DC | PRN

## 2018-11-26 MED ORDER — FENTANYL-BUPIVACAINE-NACL 0.5-0.125-0.9 MG/250ML-% EP SOLN
12.0000 mL/h | EPIDURAL | Status: DC | PRN
  Filled 2018-11-26: qty 250

## 2018-11-26 MED ORDER — OXYTOCIN BOLUS FROM INFUSION
500.0000 mL | Freq: Once | INTRAVENOUS | Status: AC
  Administered 2018-11-27: 02:00:00 500 mL via INTRAVENOUS

## 2018-11-26 MED ORDER — LACTATED RINGERS IV SOLN
500.0000 mL | Freq: Once | INTRAVENOUS | Status: AC
  Administered 2018-11-26: 15:00:00 500 mL via INTRAVENOUS

## 2018-11-26 MED ORDER — LACTATED RINGERS AMNIOINFUSION
INTRAVENOUS | Status: DC
  Administered 2018-11-26: 20:00:00 via INTRAUTERINE

## 2018-11-26 MED ORDER — LIDOCAINE HCL (PF) 1 % IJ SOLN
INTRAMUSCULAR | Status: DC | PRN
  Administered 2018-11-26: 5 mL via EPIDURAL

## 2018-11-26 MED ORDER — OXYCODONE-ACETAMINOPHEN 5-325 MG PO TABS: 1.0000 | ORAL_TABLET | ORAL | Status: DC | PRN

## 2018-11-26 MED ORDER — OXYCODONE-ACETAMINOPHEN 5-325 MG PO TABS: 2.0000 | ORAL_TABLET | ORAL | Status: DC | PRN

## 2018-11-26 MED ORDER — PHENYLEPHRINE 40 MCG/ML (10ML) SYRINGE FOR IV PUSH (FOR BLOOD PRESSURE SUPPORT): 80.0000 ug | PREFILLED_SYRINGE | INTRAVENOUS | Status: DC | PRN

## 2018-11-26 MED ORDER — FENTANYL CITRATE (PF) 100 MCG/2ML IJ SOLN: 100.0000 ug | INTRAMUSCULAR | Status: DC | PRN

## 2018-11-26 MED ORDER — DIPHENHYDRAMINE HCL 50 MG/ML IJ SOLN: 12.5000 mg | INTRAMUSCULAR | Status: DC | PRN

## 2018-11-26 MED ORDER — OXYTOCIN 40 UNITS IN NORMAL SALINE INFUSION - SIMPLE MED
2.5000 [IU]/h | INTRAVENOUS | Status: DC
  Filled 2018-11-26: qty 1000

## 2018-11-26 MED ORDER — MISOPROSTOL 25 MCG QUARTER TABLET
25.0000 ug | ORAL_TABLET | ORAL | Status: DC | PRN
  Administered 2018-11-26: 08:00:00 25 ug via VAGINAL
  Filled 2018-11-26: qty 1

## 2018-11-26 MED ORDER — SODIUM CHLORIDE (PF) 0.9 % IJ SOLN
INTRAMUSCULAR | Status: DC | PRN
  Administered 2018-11-26: 12 mL/h via EPIDURAL

## 2018-11-26 MED ORDER — LACTATED RINGERS IV SOLN
INTRAVENOUS | Status: DC
  Administered 2018-11-26 (×3): via INTRAVENOUS

## 2018-11-26 MED ORDER — SOD CITRATE-CITRIC ACID 500-334 MG/5ML PO SOLN: 30.0000 mL | ORAL | Status: DC | PRN

## 2018-11-26 MED ORDER — LACTATED RINGERS IV SOLN: 500.0000 mL | INTRAVENOUS | Status: DC | PRN

## 2018-11-26 MED ORDER — ONDANSETRON HCL 4 MG/2ML IJ SOLN: 4.0000 mg | Freq: Four times a day (QID) | INTRAMUSCULAR | Status: DC | PRN

## 2018-11-26 MED ORDER — OXYTOCIN 40 UNITS IN NORMAL SALINE INFUSION - SIMPLE MED
1.0000 m[IU]/min | INTRAVENOUS | Status: DC
  Administered 2018-11-26: 14:00:00 2 m[IU]/min via INTRAVENOUS

## 2018-11-26 MED ORDER — ACETAMINOPHEN 325 MG PO TABS: 650.0000 mg | ORAL_TABLET | ORAL | Status: DC | PRN

## 2018-11-26 MED ORDER — PHENYLEPHRINE 40 MCG/ML (10ML) SYRINGE FOR IV PUSH (FOR BLOOD PRESSURE SUPPORT)
80.0000 ug | PREFILLED_SYRINGE | INTRAVENOUS | Status: DC | PRN
  Filled 2018-11-26: qty 10

## 2018-11-26 NOTE — Anesthesia Preprocedure Evaluation (Signed)
Anesthesia Evaluation  Patient identified by MRN, date of birth, ID band Patient awake    Reviewed: Allergy & Precautions, NPO status , Patient's Chart, lab work & pertinent test results  Airway Mallampati: II  TM Distance: >3 FB Neck ROM: Full    Dental no notable dental hx. (+) Teeth Intact   Pulmonary neg pulmonary ROS,    Pulmonary exam normal breath sounds clear to auscultation       Cardiovascular Exercise Tolerance: Good Normal cardiovascular exam Rhythm:Regular Rate:Normal     Neuro/Psych negative neurological ROS  negative psych ROS   GI/Hepatic negative GI ROS,   Endo/Other    Renal/GU      Musculoskeletal   Abdominal (+) + obese,   Peds  Hematology negative hematology ROS (+)   Anesthesia Other Findings   Reproductive/Obstetrics (+) Pregnancy                             Lab Results  Component Value Date   WBC 7.9 11/26/2018   HGB 13.8 11/26/2018   HCT 40.5 11/26/2018   MCV 91.6 11/26/2018   PLT 179 11/26/2018    Anesthesia Physical Anesthesia Plan  ASA: III  Anesthesia Plan: Epidural   Post-op Pain Management:    Induction:   PONV Risk Score and Plan:   Airway Management Planned:   Additional Equipment:   Intra-op Plan:   Post-operative Plan:   Informed Consent: I have reviewed the patients History and Physical, chart, labs and discussed the procedure including the risks, benefits and alternatives for the proposed anesthesia with the patient or authorized representative who has indicated his/her understanding and acceptance.       Plan Discussed with:   Anesthesia Plan Comments:         Anesthesia Quick Evaluation

## 2018-11-26 NOTE — H&P (Addendum)
OBSTETRIC ADMISSION HISTORY AND PHYSICAL  Hannah Campbell is a 23 y.o. female G1P0 with IUP at 9534w0d by ultrasound presenting for IOL for postdates pregnancy. She reports +FMs, No LOF, no VB, no blurry vision, headaches or peripheral edema, and RUQ pain.  She plans on breast feeding. She request POPs for birth control. [facility]   Dating: By U/S 07/02/2018 --->  Estimated Date of Delivery: 11/19/18  Sono:  07/02/2018  @[redacted]w[redacted]d , CWD, normal anatomy, breech presentation, posterior, 343 g, 62% EFW, AFI 15.3 cm   Prenatal History/Complications: - none  Past Medical History: Past Medical History:  Diagnosis Date  . Medical history non-contributory     Past Surgical History: Past Surgical History:  Procedure Laterality Date  . NO PAST SURGERIES      Obstetrical History: OB History    Gravida  1   Para      Term      Preterm      AB      Living        SAB      TAB      Ectopic      Multiple      Live Births              Social History: Social History   Socioeconomic History  . Marital status: Single    Spouse name: Not on file  . Number of children: Not on file  . Years of education: Not on file  . Highest education level: Not on file  Occupational History  . Not on file  Social Needs  . Financial resource strain: Not hard at all  . Food insecurity    Worry: Never true    Inability: Never true  . Transportation needs    Medical: No    Non-medical: No  Tobacco Use  . Smoking status: Never Smoker  . Smokeless tobacco: Never Used  Substance and Sexual Activity  . Alcohol use: No  . Drug use: No  . Sexual activity: Not on file  Lifestyle  . Physical activity    Days per week: Not on file    Minutes per session: Not on file  . Stress: Not on file  Relationships  . Social Musicianconnections    Talks on phone: Not on file    Gets together: Not on file    Attends religious service: Not on file    Active member of club or  organization: Not on file    Attends meetings of clubs or organizations: Not on file    Relationship status: Not on file  Other Topics Concern  . Not on file  Social History Narrative  . Not on file    Family History: Family History  Problem Relation Age of Onset  . Diabetes Maternal Grandmother   . Hypertension Maternal Grandmother     Allergies: No Known Allergies  Medications Prior to Admission  Medication Sig Dispense Refill Last Dose  . acetaminophen (TYLENOL) 325 MG tablet Take 650 mg by mouth every 6 (six) hours as needed for mild pain.     . meloxicam (MOBIC) 7.5 MG tablet Take 1 tablet (7.5 mg total) by mouth daily. (Patient not taking: Reported on 08/29/2016) 30 tablet 0      Review of Systems   All systems reviewed and negative except as stated in HPI  Blood pressure 136/87, pulse 88, temperature 98.7 F (37.1 C), temperature source Oral, resp. rate 18, height 5\' 4"  (  1.626 m), weight 115.7 kg. General appearance: alert, cooperative, appears stated age and no distress Lungs: Breathing comfortably, no signs of acute distress or accessory muscle use Abdomen: soft, non-tender Extremities: FROM of all 4 extremities. Fetal monitoringBaseline: 140 bpm, Variability: Good {> 6 bpm), Accelerations: Reactive, Decelerations: Absent and cat I strip Uterine activity: occasional contractions, uterine irritability Dilation: 1.5 Effacement (%): 50 Station: -3 Exam by:: August Albino RNC   Prenatal labs: ABO, Rh: --/--/A POS (08/31 0757) Antibody: NEG (08/31 0757) Rubella: Immune (03/09 0000) RPR: Nonreactive (03/09 0000)  HBsAg: Negative (03/09 0000)  HIV: Non-reactive (03/09 0000)  GBS: Negative (07/27 0000)  1 hr Glucola 119 Genetic screening: quad and CF screen negative Anatomy US normal  Prenatal Transfer Tool  Maternal Diabetes: No Genetic Screening: Normal Maternal Ultrasounds/Referrals: Normal Fetal Ultrasounds or other Referrals:  None Maternal Substance  Abuse:  No Significant Maternal Medications:  None Significant Maternal Lab Results: Group B Strep negative  Results for orders placed or performed during the hospital encounter of 11/26/18 (from the past 24 hour(s))  CBC   Collection Time: 11/26/18  7:57 AM  Result Value Ref Range   WBC 7.9 4.0 - 10.5 K/uL   RBC 4.42 3.87 - 5.11 MIL/uL   Hemoglobin 13.8 12.0 - 15.0 g/dL   HCT 40.5 36.0 - 46.0 %   MCV 91.6 80.0 - 100.0 fL   MCH 31.2 26.0 - 34.0 pg   MCHC 34.1 30.0 - 36.0 g/dL   RDW 13.3 11.5 - 15.5 %   Platelets 179 150 - 400 K/uL   nRBC 0.0 0.0 - 0.2 %  Type and screen   Collection Time: 11/26/18  7:57 AM  Result Value Ref Range   ABO/RH(D) A POS    Antibody Screen NEG    Sample Expiration      11/29/2018,2359 Performed at [facility], [place] [address], [place], [place] [address]     Patient Active Problem List   Diagnosis Date Noted  . Post term pregnancy over 40 weeks 11/26/2018  . Contraception management 10/06/2014    Assessment/Plan:  Hannah Campbell is a 23 y.o. G1P0 at [redacted]w[redacted]d here for IOL.  #Labor: Foley balloon catheter placed. Will continue to monitor until balloon falls out. Cytotec q4h. #Pain: Epidural as desired #FWB: Reassuring strip, no indications for intervention at this time #ID:  GBS negative #MOF: breast #MOC: [place], Medical Student  11/26/2018, 9:27 AM  I confirm that I have verified the information documented in the medical student's note and that I have also personally reperformed the history, physical exam and all medical decision making activities of this service and have verified that all service and findings are accurately documented in this student's note.   Foley bulb placed by CNM @ 0900. IOL with cytotec and FB  CAT I tracing  Pain medication ordered PRN, patient plans natural labor in early stages but plans for epidural around 7cm   Lajean Manes, CNM 11/26/2018 10:11 AM

## 2018-11-26 NOTE — Progress Notes (Signed)
Vitals:   11/26/18 1112 11/26/18 1300 11/26/18 1345 11/26/18 1401  BP: (!) 100/47 (!) 115/56 (!) 90/51 109/85  Pulse: 85 97 90 100  Resp: 18 18 17 18   Temp:  98.3 F (36.8 C)    TempSrc:  Oral    Weight:      Height:       Patient breathing through contractions  Declines pain medication or epidural at this time   Discussed AROM with patient now that she is 7.5cm, patient agrees, AROM performed- clear fluid   Currently on 57milli-unit/min of pitocin  Cat I tracing  Expectant management  Plans SVD    Lajean Manes, CNM 11/26/18, 2:10 PM

## 2018-11-26 NOTE — Anesthesia Procedure Notes (Signed)
Epidural Patient location during procedure: OB Start time: 11/26/2018 2:38 PM End time: 11/26/2018 2:53 PM  Staffing Anesthesiologist: Barnet Glasgow, MD Performed: anesthesiologist   Preanesthetic Checklist Completed: patient identified, site marked, surgical consent, pre-op evaluation, timeout performed, IV checked, risks and benefits discussed and monitors and equipment checked  Epidural Patient position: sitting Prep: site prepped and draped and DuraPrep Patient monitoring: continuous pulse ox and blood pressure Approach: midline Location: L3-L4 Injection technique: LOR air  Needle:  Needle type: Tuohy  Needle gauge: 17 G Needle length: 9 cm and 9 Needle insertion depth: 9 cm Catheter type: closed end flexible Catheter size: 19 Gauge Catheter at skin depth: 15 cm Test dose: negative  Assessment Events: blood not aspirated, injection not painful, no injection resistance, negative IV test and no paresthesia  Additional Notes Patient identified. Risks/Benefits/Options discussed with patient including but not limited to bleeding, infection, nerve damage, paralysis, failed block, incomplete pain control, headache, blood pressure changes, nausea, vomiting, reactions to medication both or allergic, itching and postpartum back pain. Confirmed with bedside nurse the patient's most recent platelet count. Confirmed with patient that they are not currently taking any anticoagulation, have any bleeding history or any family history of bleeding disorders. Patient expressed understanding and wished to proceed. All questions were answered. Sterile technique was used throughout the entire procedure. Please see nursing notes for vital signs. Test dose was given through epidural needle and negative prior to continuing to dose epidural or start infusion. Warning signs of high block given to the patient including shortness of breath, tingling/numbness in hands, complete motor block, or any  concerning symptoms with instructions to call for help. Patient was given instructions on fall risk and not to get out of bed. All questions and concerns addressed with instructions to call with any issues. 2 Attempt (S) . Patient tolerated procedure well.

## 2018-11-26 NOTE — Progress Notes (Signed)
LABOR PROGRESS NOTE  Hannah Campbell is a 23 y.o. G1P0 at [redacted]w[redacted]d admitted for IOL for PD   Subjective: Patient comfortable with epidural. Resting on right side.   Objective: BP 118/65   Pulse 88   Temp 98.1 F (36.7 C) (Oral)   Resp 18   Ht 5\' 4"  (1.626 m)   Wt 115.7 kg   SpO2 100%   BMI 43.77 kg/m  or  Vitals:   11/26/18 1903 11/26/18 1932 11/26/18 2001 11/26/18 2030  BP: (!) 102/40 (!) 100/41 113/63 118/65  Pulse: 86 80 95 88  Resp: 18 18 18    Temp:  98.1 F (36.7 C)    TempSrc:  Oral    SpO2:      Weight:      Height:       Dilation: 8 Effacement (%): 90 Station: -2 Presentation: Vertex Exam by:: Lenise Herald RN FHT: baseline rate 135, moderate varibility, accels present, early decels  Ctx: q2-74m  Labs: Lab Results  Component Value Date   WBC 7.9 11/26/2018   HGB 13.8 11/26/2018   HCT 40.5 11/26/2018   MCV 91.6 11/26/2018   PLT 179 11/26/2018    Patient Active Problem List   Diagnosis Date Noted  . Post term pregnancy over 40 weeks 11/26/2018  . Contraception management 10/06/2014    Assessment / Plan: 23 y.o. G1P0 at [redacted]w[redacted]d here for IOL for PD.  Labor: Patient on right side where cervix was felt with peanut ball. Pit at 6 and will decrease to 4 as MVU's still > adequate at ~ 290 and intermittently Cat II FHT Fetal Wellbeing:  Cat I currently; intermittently Cat II  Pain Control:  Epidural  Anticipated MOD:  Hopeful SVD   Chauncey Mann, MD 11/26/2018, 8:33 PM

## 2018-11-26 NOTE — Progress Notes (Signed)
LABOR PROGRESS NOTE  Hannah Campbell is a 23 y.o. G1P0 at [redacted]w[redacted]d admitted for IOL for PD   Subjective: Pushing.   Objective: BP 118/69   Pulse (!) 179   Temp 98.1 F (36.7 C) (Oral)   Resp 18   Ht 5\' 4"  (1.626 m)   Wt 115.7 kg   SpO2 100%   BMI 43.77 kg/m  or  Vitals:   11/26/18 2232 11/26/18 2304 11/26/18 2320 11/26/18 2332  BP: (!) 112/55 107/67  118/69  Pulse: 89 96  (!) 179  Resp: 18     Temp: 99.4 F (37.4 C)  98.1 F (36.7 C)   TempSrc: Axillary  Oral   SpO2:      Weight:      Height:       Dilation: 10 Dilation Complete Date: 11/26/18 Dilation Complete Time: 2142 Effacement (%): 100 Cervical Position: Middle Station: 0 Presentation: Vertex Exam by:: Itzayanna Kaster FHT: baseline rate 135, moderate varibility, accels present, early decels  Ctx: q2-46m  Labs: Lab Results  Component Value Date   WBC 7.9 11/26/2018   HGB 13.8 11/26/2018   HCT 40.5 11/26/2018   MCV 91.6 11/26/2018   PLT 179 11/26/2018    Patient Active Problem List   Diagnosis Date Noted  . Post term pregnancy over 40 weeks 11/26/2018  . Contraception management 10/06/2014    Assessment / Plan: 23 y.o. G1P0 at [redacted]w[redacted]d here for IOL for PD.  Labor: Pushing with good progress, now +2 station. Pit at 6. Anticipate SVD.  Fetal Wellbeing:  Cat I currently Pain Control:  Epidural   Chauncey Mann, MD 11/26/2018, 11:42 PM

## 2018-11-26 NOTE — Progress Notes (Signed)
LABOR PROGRESS NOTE  Hannah Campbell is a 23 y.o. G1P0 at [redacted]w[redacted]d  admitted for IOL for PD   Subjective: Patient comfortable with epidural   Objective: BP (!) 107/44   Pulse (!) 102   Temp 97.9 F (36.6 C) (Oral)   Resp 18   Ht 5\' 4"  (1.626 m)   Wt 115.7 kg   SpO2 100%   BMI 43.77 kg/m  or  Vitals:   11/26/18 1632 11/26/18 1705 11/26/18 1732 11/26/18 1801  BP: (!) 96/48 (!) 100/50 (!) 96/49 (!) 107/44  Pulse: 79 79 85 (!) 102  Resp: 16 16 17 18   Temp:    97.9 F (36.6 C)  TempSrc:    Oral  SpO2:      Weight:      Height:        IUPC placed  Dilation: 7 Effacement (%): 80 Station: -2 Presentation: Vertex Exam by:: Wende Bushy CNM FHT: baseline rate 135, moderate varibility, +accel, variable decel Toco: 1.5-2, moderate by palpation   Labs: Lab Results  Component Value Date   WBC 7.9 11/26/2018   HGB 13.8 11/26/2018   HCT 40.5 11/26/2018   MCV 91.6 11/26/2018   PLT 179 11/26/2018    Patient Active Problem List   Diagnosis Date Noted  . Post term pregnancy over 40 weeks 11/26/2018  . Contraception management 10/06/2014    Assessment / Plan: 23 y.o. G1P0 at [redacted]w[redacted]d here for IOL for PD   Labor: Slow to progress- IUPC placed, MVUs 260-320- pitocin cut down to 6 milli-unit/min. Patient placed on right side with peanut.  Fetal Wellbeing:  Cat II  Pain Control:  Epidural  Anticipated MOD:  Hopeful SVD   Lajean Manes, CNM 11/26/2018, 6:11 PM

## 2018-11-26 NOTE — Progress Notes (Signed)
LABOR PROGRESS NOTE  Hannah Campbell is a 23 y.o. Campbell at [redacted]w[redacted]d  admitted for IOL for PD   Subjective: Patient comfortable, reports occasional cramping and contractions   Objective: BP (!) 100/47   Pulse 85   Temp 98.7 F (37.1 C) (Oral)   Resp 18   Ht 5\' 4"  (1.626 m)   Wt 115.7 kg   BMI 43.77 kg/m  or  Vitals:   11/26/18 0802 11/26/18 0859 11/26/18 1003 11/26/18 1112  BP: 118/69 136/87 (!) 126/105 (!) 100/47  Pulse: 96 88 81 85  Resp: 16 18 18 18   Temp: 98.7 F (37.1 C)     TempSrc: Oral     Weight:      Height:        FB out @ 1158 Dilation: 6 Effacement (%): 80 Station: -3 Presentation: Vertex Exam by:: August Albino RNC  FHT: baseline rate 145, moderate varibility, +accel, no decel Toco: irregular contractions, mild by palpation   Labs: Lab Results  Component Value Date   WBC 7.9 11/26/2018   HGB 13.8 11/26/2018   HCT 40.5 11/26/2018   MCV 91.6 11/26/2018   PLT 179 11/26/2018    Patient Active Problem List   Diagnosis Date Noted  . Post term pregnancy over 40 weeks 11/26/2018  . Contraception management 10/06/2014    Assessment / Plan: Hannah Campbell at [redacted]w[redacted]d here for IOL for PD   Labor: FB out, pitocin initiated  Fetal Wellbeing:  Cat I  Pain Control:  Pain medication ordered PRN Anticipated MOD:  SVD  Lajean Manes, CNM 11/26/2018, 12:24 PM

## 2018-11-27 ENCOUNTER — Encounter (HOSPITAL_COMMUNITY): Payer: Self-pay | Admitting: *Deleted

## 2018-11-27 DIAGNOSIS — Z3A41 41 weeks gestation of pregnancy: Secondary | ICD-10-CM

## 2018-11-27 DIAGNOSIS — O480 Post-term pregnancy: Secondary | ICD-10-CM

## 2018-11-27 MED ORDER — MEASLES, MUMPS & RUBELLA VAC IJ SOLR: 0.5000 mL | Freq: Once | INTRAMUSCULAR | Status: DC

## 2018-11-27 MED ORDER — PRENATAL MULTIVITAMIN CH
1.0000 | ORAL_TABLET | Freq: Every day | ORAL | Status: DC
  Administered 2018-11-27 – 2018-11-29 (×2): 1 via ORAL
  Filled 2018-11-27 (×3): qty 1

## 2018-11-27 MED ORDER — COCONUT OIL OIL
1.0000 "application " | TOPICAL_OIL | Status: DC | PRN
  Administered 2018-11-28: 1 via TOPICAL

## 2018-11-27 MED ORDER — WITCH HAZEL-GLYCERIN EX PADS
1.0000 "application " | MEDICATED_PAD | CUTANEOUS | Status: DC | PRN
  Administered 2018-11-27: 1 via TOPICAL

## 2018-11-27 MED ORDER — SIMETHICONE 80 MG PO CHEW: 80.0000 mg | CHEWABLE_TABLET | ORAL | Status: DC | PRN

## 2018-11-27 MED ORDER — ACETAMINOPHEN 325 MG PO TABS: 650.0000 mg | ORAL_TABLET | Freq: Four times a day (QID) | ORAL | Status: DC | PRN

## 2018-11-27 MED ORDER — IBUPROFEN 600 MG PO TABS
600.0000 mg | ORAL_TABLET | Freq: Three times a day (TID) | ORAL | Status: DC | PRN
  Administered 2018-11-27 – 2018-11-28 (×4): 600 mg via ORAL
  Filled 2018-11-27 (×7): qty 1

## 2018-11-27 MED ORDER — SENNOSIDES-DOCUSATE SODIUM 8.6-50 MG PO TABS
2.0000 | ORAL_TABLET | ORAL | Status: DC
  Administered 2018-11-28: 23:00:00 2 via ORAL
  Filled 2018-11-27 (×2): qty 2

## 2018-11-27 MED ORDER — ONDANSETRON HCL 4 MG/2ML IJ SOLN: 4.0000 mg | INTRAMUSCULAR | Status: DC | PRN

## 2018-11-27 MED ORDER — ONDANSETRON HCL 4 MG PO TABS: 4.0000 mg | ORAL_TABLET | ORAL | Status: DC | PRN

## 2018-11-27 MED ORDER — BENZOCAINE-MENTHOL 20-0.5 % EX AERO: 1.0000 "application " | INHALATION_SPRAY | CUTANEOUS | Status: DC | PRN

## 2018-11-27 MED ORDER — TETANUS-DIPHTH-ACELL PERTUSSIS 5-2.5-18.5 LF-MCG/0.5 IM SUSP: 0.5000 mL | Freq: Once | INTRAMUSCULAR | Status: DC

## 2018-11-27 MED ORDER — DIPHENHYDRAMINE HCL 25 MG PO CAPS: 25.0000 mg | ORAL_CAPSULE | Freq: Four times a day (QID) | ORAL | Status: DC | PRN

## 2018-11-27 MED ORDER — DIBUCAINE (PERIANAL) 1 % EX OINT: 1.0000 "application " | TOPICAL_OINTMENT | CUTANEOUS | Status: DC | PRN

## 2018-11-27 NOTE — Anesthesia Postprocedure Evaluation (Signed)
Anesthesia Post Note  Patient: Shalia Bartko  Procedure(s) Performed: AN AD [place]     Patient location during evaluation: Mother Baby Anesthesia Type: Epidural Level of consciousness: awake and alert Pain management: pain level controlled Vital Signs Assessment: post-procedure vital signs reviewed and stable Respiratory status: spontaneous breathing, nonlabored ventilation and respiratory function stable Cardiovascular status: stable Postop Assessment: no headache, no backache, epidural receding, no apparent nausea or vomiting, patient able to bend at knees, adequate PO intake and able to ambulate Anesthetic complications: no    Last Vitals:  Vitals:   11/27/18 0402 11/27/18 0518  BP: 123/79 128/73  Pulse: 100 98  Resp: 17 18  Temp: 37.3 C   SpO2: 100% 100%    Last Pain:  Vitals:   11/27/18 0727  TempSrc:   PainSc: 3    Pain Goal:                   Jabier Mutton

## 2018-11-27 NOTE — Lactation Note (Signed)
This note was copied from a baby's chart. Lactation Consultation Note  Patient Name: Hannah Campbell [id number] Date: 11/27/2018 Reason for consult: Initial assessment;Primapara;1st time breastfeeding;Term  1709 - 1728 - I conducted an initial lactation visit to Ms. Owens Shark. She was breast feeding her daughter Abran Richard in football hold on her left breast upon entry. I observed Ari with rhythmic suckling sequences. Ms. Kelm has pendulous breasts. She reports positive breast changes with pregnancy.  Ms. Filion indicated that she was shown how to hand express. She had a manual pump with her in the room. She prefers to pre-pump to "get the milk flowing" over hand expression.  I recommended that she breast feed baby 8-12 times a day on demand (feeding cues discussed) and that she and her significant other hold baby skin to skin. Mom appeared quite sleepy, and I recommended rest when baby rests.  I educated on day 1 and day 2 infant feeding patterns, when to expect milk to transition and the benefits of her breast milk. Mom indicates that she is highly motivated to breast feed. She is the first in her family to breast feed. I mentioned that [facility] had various supports for breast feeding after discharge and she appeared interested. She also indicated possible interest in an outpatient follow up. I agreed to put in a note for the OP consult to follow up with her.   Maternal Data Formula Feeding for Exclusion: No Has patient been taught Hand Expression?: Yes Does the patient have breastfeeding experience prior to this delivery?: No  Feeding Feeding Type: Breast Fed  LATCH Score Latch: Grasps breast easily, tongue down, lips flanged, rhythmical sucking.  Audible Swallowing: A few with stimulation  Type of Nipple: Everted at rest and after stimulation  Comfort (Breast/Nipple): Soft / non-tender  Hold (Positioning): No assistance needed to correctly position infant at breast.  LATCH Score:  9  Interventions Interventions: Breast feeding basics reviewed;Skin to skin;Hand express;Breast compression  Lactation Tools Discussed/Used Tools: Pump Breast pump type: Manual Pump Review: Setup, frequency, and cleaning   Consult Status Consult Status: Follow-up Date: 11/28/18 Follow-up type: In-patient    Lenore Manner 11/27/2018, 5:41 PM

## 2018-11-27 NOTE — Discharge Summary (Signed)
Postpartum Discharge Summary     Patient Name: Hannah Campbell DOB: Jan 10, 1996 MRN: [id number]  Date of admission: 11/26/2018 Delivering Provider: Chauncey Mann   Date of discharge: 11/28/2018  Admitting diagnosis: pregnancy Intrauterine pregnancy: [redacted]w[redacted]d    Secondary diagnosis:  Active Problems:   Post term pregnancy over 40 weeks  Additional problems: None     Discharge diagnosis: Term Pregnancy Delivered                                                                                                Post partum procedures:None  Augmentation: AROM, Pitocin, Cytotec and Foley Balloon  Complications: None  Hospital course:  Induction of Labor With Vaginal Delivery   23y.o. yo G1P0 at 467w1das admitted to the hospital 11/26/2018 for induction of labor.  Indication for induction: Postdates.  Patient had an uncomplicated labor course as follows. Patient presented to L&D for IOL for post-dates. Initial SVE: 1.5/50%/-3. Patient received Cytotec, Foley Bulb, Pitocin, AROM. Epidural was placed. She then progressed to complete.  Membrane Rupture Time/Date: 1:55 PM ,11/26/2018   Intrapartum Procedures: Episiotomy: None [1]                                         Lacerations:  1st degree [2];Vaginal [6]  Patient had delivery of a Viable infant.  Information for the patient's newborn:  BrKeshanna, Riso0[[id number]]Delivery Method: Vaginal, Spontaneous(Filed from Delivery Summary)    11/27/2018  Details of delivery can be found in separate delivery note. Patient is considering outpatient Nexplanon. Micronor prescribed on DC. Patient had a routine postpartum course. Patient is discharged home 11/28/18. Delivery time: 1:53 AM    Magnesium Sulfate received: No BMZ received: No Rhophylac:No MMR:No Transfusion:No  Physical exam  Vitals:   11/27/18 1430 11/27/18 1739 11/27/18 2125 11/28/18 0613  BP: 127/68 112/65 122/64 109/66  Pulse: 72 74 96 89  Resp: '16 18 18 18  ' Temp: 98.2 F (36.8  C) 99.1 F (37.3 C) 98.5 F (36.9 C) (!) 97.5 F (36.4 C)  TempSrc: Oral Oral Oral Oral  SpO2:  100%    Weight:      Height:       General: alert, cooperative and no distress Lochia: appropriate Uterine Fundus: firm Incision: N/A DVT Evaluation: No evidence of DVT seen on physical exam. Labs: Lab Results  Component Value Date   WBC 7.9 11/26/2018   HGB 13.8 11/26/2018   HCT 40.5 11/26/2018   MCV 91.6 11/26/2018   PLT 179 11/26/2018   CMP Latest Ref Rng & Units 08/29/2016  Glucose 65 - 99 mg/dL 95  BUN 6 - 20 mg/dL 7  Creatinine 0.44 - 1.00 mg/dL 0.79  Sodium 135 - 145 mmol/L 137  Potassium 3.5 - 5.1 mmol/L 3.7  Chloride 101 - 111 mmol/L 106  CO2 22 - 32 mmol/L 23  Calcium 8.9 - 10.3 mg/dL 9.7  Total Protein 6.1 - 8.1 g/dL -  Total Bilirubin 0.2 - 1.2 mg/dL -  Alkaline Phos 33 - 115 U/L -  AST 10 - 30 U/L -  ALT 6 - 29 U/L -    Discharge instruction: per After Visit Summary and "Baby and Me Booklet".  After visit meds:  Allergies as of 11/28/2018   No Known Allergies     Medication List    TAKE these medications   acetaminophen 325 MG tablet Commonly known as: Tylenol Take 2 tablets (650 mg total) by mouth every 6 (six) hours as needed (for pain scale < 4).   ibuprofen 600 MG tablet Commonly known as: ADVIL Take 1 tablet (600 mg total) by mouth every 8 (eight) hours as needed for mild pain.   norethindrone 0.35 MG tablet Commonly known as: MICRONOR Take 1 tablet (0.35 mg total) by mouth daily.       Diet: routine diet  Activity: Advance as tolerated. Pelvic rest for 6 weeks.   Outpatient follow up:4 weeks Follow up Appt:No future appointments. Follow up Visit:   Please schedule this patient for Postpartum visit in: 4 weeks with the following provider: Any provider Low risk pregnancy complicated by: None Delivery mode:  SVD Anticipated Birth Control:  POPs; considering outpatient Nexplanon PP Procedures needed: None  Schedule Integrated BH  visit: no   Newborn Data: Live born female  Birth Weight:   APGAR: 62, 9  Newborn Delivery   Birth date/time: 11/27/2018 01:53:00 Delivery type: Vaginal, Spontaneous      Baby Feeding: Breast Disposition:home with mother   11/28/2018 Chauncey Mann, MD

## 2018-11-28 MED ORDER — OXYCODONE HCL 5 MG PO TABS: 5.0000 mg | ORAL_TABLET | ORAL | Status: DC | PRN

## 2018-11-28 MED ORDER — IBUPROFEN 600 MG PO TABS: 600.0000 mg | ORAL_TABLET | Freq: Three times a day (TID) | ORAL | 0 refills | Status: DC | PRN

## 2018-11-28 MED ORDER — NORETHINDRONE 0.35 MG PO TABS: 1.0000 | ORAL_TABLET | Freq: Every day | ORAL | 11 refills | Status: DC

## 2018-11-28 MED ORDER — ACETAMINOPHEN 325 MG PO TABS: 650.0000 mg | ORAL_TABLET | Freq: Four times a day (QID) | ORAL | 1 refills | Status: AC | PRN

## 2018-11-28 NOTE — Lactation Note (Addendum)
This note was copied from a baby's chart. Lactation Consultation Note  Patient Name: Hannah Campbell [id number] Date: 11/28/2018 Reason for consult: Follow-up assessment   Baby 36 hours old.  Mother has good flow of hand expression, squirting. Mother has only been breastfeeding one breast per session. Encouraged after burping to try on both breasts per feeding if baby is willing. Observed feeding in cross cradle and football hold with swallows. Spoon fed baby approx 4 ml between breasts. Worked with mother on positioning and compression.  Provided mother with manual pump.  Encouraged mother to either post pump or hand express and spoon feed extra to baby.  Mother uncomfortable with bf positions.  Demonstrated how to latch in side lying position.  Reminded mother to set her cell phone for 15 min and FOB to check on mother and baby while in position.        Maternal Data Has patient been taught Hand Expression?: Yes Does the patient have breastfeeding experience prior to this delivery?: No  Feeding Feeding Type: Breast Fed  LATCH Score Latch: Grasps breast easily, tongue down, lips flanged, rhythmical sucking.  Audible Swallowing: A few with stimulation  Type of Nipple: Everted at rest and after stimulation  Comfort (Breast/Nipple): Soft / non-tender  Hold (Positioning): No assistance needed to correctly position infant at breast.  LATCH Score: 9  Interventions Interventions: Breast feeding basics reviewed;Assisted with latch;Hand pump;Breast compression;Support pillows;Adjust position;Position options;Expressed milk  Lactation Tools Discussed/Used     Consult Status Consult Status: Follow-up Date: 11/29/18 Follow-up type: In-patient    [name] [facility] 11/28/2018, 11:56 AM

## 2018-11-29 MED ORDER — NORETHINDRONE 0.35 MG PO TABS: 1.0000 | ORAL_TABLET | Freq: Every day | ORAL | 11 refills | Status: AC

## 2018-11-29 NOTE — Discharge Summary (Signed)
Postpartum Discharge Summary   Updated to reflect change in discharge date and new day of discharge assessment     Patient Name: Hannah Campbell DOB: Sep 16, 1995 MRN: [id number]  Date of admission: 11/26/2018 Delivering Provider: Chauncey Mann   Date of discharge: 11/29/2018  Admitting diagnosis: pregnancy Intrauterine pregnancy: [redacted]w[redacted]d    Secondary diagnosis:  Active Problems:   Post term pregnancy over 40 weeks  Additional problems: None     Discharge diagnosis: Term Pregnancy Delivered                                                                                                Post partum procedures:None  Augmentation: AROM, Pitocin, Cytotec and Foley Balloon  Complications: None  Hospital course:  Induction of Labor With Vaginal Delivery   23y.o. yo G1P0 at 433w1das admitted to the hospital 11/26/2018 for induction of labor.  Indication for induction: Postdates.  Patient had an uncomplicated labor course as follows. Patient presented to L&D for IOL for post-dates. Initial SVE: 1.5/50%/-3. Patient received Cytotec, Foley Bulb, Pitocin, AROM. Epidural was placed. She then progressed to complete.  Membrane Rupture Time/Date: 1:55 PM ,11/26/2018   Intrapartum Procedures: Episiotomy: None [1]                                         Lacerations:  1st degree [2];Vaginal [6]  Patient had delivery of a Viable infant.  Information for the patient's newborn:  BrYaelis, Scharfenberg0[[id number]]Delivery Method: Vaginal, Spontaneous(Filed from Delivery Summary)    11/27/2018  Details of delivery can be found in separate delivery note. Patient is considering outpatient Nexplanon. Micronor prescribed on DC. Patient had a routine postpartum course. Patient is discharged home 11/29/18. Delivery time: 1:53 AM    Magnesium Sulfate received: No BMZ received: No Rhophylac:No MMR:No Transfusion:No  Physical exam  Vitals:   11/27/18 2125 11/28/18 0613 11/28/18 1956 11/29/18 0600  BP: 122/64  109/66 116/76 (!) 110/53  Pulse: 96 89 79 75  Resp: 18 18 18 18   Temp: 98.5 F (36.9 C) (!) 97.5 F (36.4 C) 98.7 F (37.1 C) 98.3 F (36.8 C)  TempSrc: Oral Oral Oral Oral  SpO2:    100%  Weight:      Height:       General: alert, cooperative and no distress Lochia: appropriate Uterine Fundus: firm Incision: N/A DVT Evaluation: No evidence of DVT seen on physical exam. Labs: Lab Results  Component Value Date   WBC 7.9 11/26/2018   HGB 13.8 11/26/2018   HCT 40.5 11/26/2018   MCV 91.6 11/26/2018   PLT 179 11/26/2018   CMP Latest Ref Rng & Units 08/29/2016  Glucose 65 - 99 mg/dL 95  BUN 6 - 20 mg/dL 7  Creatinine 0.44 - 1.00 mg/dL 0.79  Sodium 135 - 145 mmol/L 137  Potassium 3.5 - 5.1 mmol/L 3.7  Chloride 101 - 111 mmol/L 106  CO2 22 - 32 mmol/L 23  Calcium 8.9 - 10.3  mg/dL 9.7  Total Protein 6.1 - 8.1 g/dL -  Total Bilirubin 0.2 - 1.2 mg/dL -  Alkaline Phos 33 - 115 U/L -  AST 10 - 30 U/L -  ALT 6 - 29 U/L -    Discharge instruction: per After Visit Summary and "Baby and Me Booklet".  After visit meds:  Allergies as of 11/29/2018   No Known Allergies     Medication List    TAKE these medications   acetaminophen 325 MG tablet Commonly known as: Tylenol Take 2 tablets (650 mg total) by mouth every 6 (six) hours as needed (for pain scale < 4).   ibuprofen 600 MG tablet Commonly known as: ADVIL Take 1 tablet (600 mg total) by mouth every 8 (eight) hours as needed for mild pain.   norethindrone 0.35 MG tablet Commonly known as: MICRONOR Take 1 tablet (0.35 mg total) by mouth daily. Start taking at 4 weeks postpartum, around 01/22/19       Diet: routine diet  Activity: Advance as tolerated. Pelvic rest for 6 weeks.   Outpatient follow up:4 weeks Follow up Appt:No future appointments. Follow up Visit: Follow-up Information    Department, [facility]. Schedule an appointment as soon as possible for a visit in 4 week(s).   Why: Please make an  appointment for postpartum care at your Health Dept location in 4-6 weeks Contact information: [place] [address] [phone]            Please schedule this patient for Postpartum visit in: 4 weeks with the following provider: Any provider Low risk pregnancy complicated by: None Delivery mode:  SVD Anticipated Birth Control:  POPs  prescribed by Dr. Marice Potter at discharge PP Procedures needed: None  Schedule Integrated Goff visit: no   Newborn Data: Live born female  Birth Weight:   APGAR: 7, 9  Newborn Delivery   Birth date/time: 11/27/2018 01:53:00 Delivery type: Vaginal, Spontaneous      Baby Feeding: Breast Disposition:home with mother   11/29/2018 Darlina Rumpf, CNM

## 2018-11-29 NOTE — Discharge Instructions (Signed)

## 2018-11-29 NOTE — Lactation Note (Signed)
This note was copied from a baby's chart. Lactation Consultation Note  Patient Name: Girl Zierra Laroque Today's Date: 11/29/2018   P1, Baby 21 hours old and latched upon entering. Mother had good flow of transitional breastmilk from L breast, squirting. Feed on demand with cues.  Goal 8-12+ times per day after first 24 hrs.  Place baby STS if not cueing.  Reviewed engorgement care and monitoring voids/stools. Mother has personal DEBP.  Discussed pumping and giving additional breastmilk to baby.      Maternal Data    Feeding Feeding Type: Breast Fed  LATCH Score                   Interventions    Lactation Tools Discussed/Used     Consult Status      Carlye Grippe 11/29/2018, 9:37 AM

## 2019-06-05 ENCOUNTER — Ambulatory Visit: Payer: Self-pay | Admitting: *Deleted

## 2019-06-05 NOTE — Telephone Encounter (Signed)
Pt called with complaints of abdominal pain and a burning sensation in her back; her symptoms started for the past 30 min; she had 1 previous episode February 2021; the pt says her lower abdomen pain was  originally 10 out of 10; now it is 2 out of 10; she says the pains remind her of contractions; she says the burning sensation in her back is" like her back is inflamed" with her discomfort rated 2 out of 10; recommendations made per nurse triage protocol; she verbalized understanding; the pt says she is seen at the [facility], and she will contact them for an appt.   Reason for Disposition . Age > 60 years  Answer Assessment - Initial Assessment Questions 1. LOCATION: "Where does it hurt?"      Lower abbdomen 2. RADIATION: "Does the pain shoot anywhere else?" (e.g., chest, back)     Feels like burning in lower back 3. ONSET: "When did the pain begin?" (e.g., minutes, hours or days ago)      06/05/19 around 0630 4. SUDDEN: "Gradual or sudden onset?"    sudden 5. PATTERN "Does the pain come and go, or is it constant?"    - If constant: "Is it getting better, staying the same, or worsening?"      (Note: Constant means the pain never goes away completely; most serious pain is constant and it progresses)     - If intermittent: "How long does it last?" "Do you have pain now?"     (Note: Intermittent means the pain goes away completely between bouts)    intermittent 6. SEVERITY: "How bad is the pain?"  (e.g., Scale 1-10; mild, moderate, or severe)   - MILD (1-3): doesn't interfere with normal activities, abdomen soft and not tender to touch    - MODERATE (4-7): interferes with normal activities or awakens from sleep, tender to touch    - SEVERE (8-10): excruciating pain, doubled over, unable to do any normal activities     Originally 10 out 10; now 2 out of 10  7. RECURRENT SYMPTOM: "Have you ever had this type of abdominal pain before?" If so, ask: "When was the last time?" and  "What happened that time?"      Yes; same as above 8. CAUSE: "What do you think is causing the abdominal pain?"     not sure 9. RELIEVING/AGGRAVATING FACTORS: "What makes it better or worse?" (e.g., movement, antacids, bowel movement)    Movement makes worse 10. OTHER SYMPTOMS: "Has there been any vomiting, diarrhea, constipation, or urine problems?"       no 11. PREGNANCY: "Is there any chance you are pregnant?" "When was your last menstrual period?"       Birth control pills; pt is breastfeeding and has not had a period  Protocols used: ABDOMINAL PAIN - [facility]

## 2021-05-05 ENCOUNTER — Other Ambulatory Visit (HOSPITAL_COMMUNITY): Payer: Self-pay | Admitting: Family

## 2021-05-05 DIAGNOSIS — N920 Excessive and frequent menstruation with regular cycle: Secondary | ICD-10-CM

## 2021-05-11 ENCOUNTER — Other Ambulatory Visit: Payer: Self-pay

## 2021-05-11 ENCOUNTER — Ambulatory Visit (HOSPITAL_COMMUNITY)
Admission: RE | Admit: 2021-05-11 | Discharge: 2021-05-11 | Disposition: A | Discharge status: Home / Self Care | Payer: Medicaid Other | Source: Ambulatory Visit | Attending: Family | Admitting: Family

## 2021-05-11 DIAGNOSIS — N920 Excessive and frequent menstruation with regular cycle: Secondary | ICD-10-CM | POA: Diagnosis present

## 2021-06-28 ENCOUNTER — Encounter (HOSPITAL_COMMUNITY): Payer: Self-pay | Admitting: *Deleted

## 2021-06-28 ENCOUNTER — Other Ambulatory Visit: Payer: Self-pay

## 2021-06-28 ENCOUNTER — Ambulatory Visit (HOSPITAL_COMMUNITY)
Admission: EM | Admit: 2021-06-28 | Discharge: 2021-06-28 | Disposition: A | Discharge status: Home / Self Care | Payer: Medicaid Other | Attending: Nurse Practitioner | Admitting: Nurse Practitioner

## 2021-06-28 DIAGNOSIS — N898 Other specified noninflammatory disorders of vagina: Secondary | ICD-10-CM | POA: Diagnosis present

## 2021-06-28 DIAGNOSIS — R1030 Lower abdominal pain, unspecified: Secondary | ICD-10-CM | POA: Insufficient documentation

## 2021-06-28 LAB — POC URINE PREG, ED: Preg Test, Ur: NEGATIVE

## 2021-06-28 LAB — POCT URINALYSIS DIPSTICK, ED / UC
Bilirubin Urine: NEGATIVE
Glucose, UA: NEGATIVE mg/dL
Ketones, ur: NEGATIVE mg/dL
Nitrite: NEGATIVE
Protein, ur: 30 mg/dL — AB
Specific Gravity, Urine: 1.025 (ref 1.005–1.030)
Urobilinogen, UA: 0.2 mg/dL (ref 0.0–1.0)
pH: 6 (ref 5.0–8.0)

## 2021-06-28 LAB — HIV ANTIBODY (ROUTINE TESTING W REFLEX): HIV Screen 4th Generation wRfx: NONREACTIVE

## 2021-06-28 MED ORDER — CEPHALEXIN 500 MG PO CAPS: 500.0000 mg | ORAL_CAPSULE | Freq: Four times a day (QID) | ORAL | 0 refills | Status: AC

## 2021-06-28 NOTE — Discharge Instructions (Signed)
-   We are treating you for a urinary tract infection ?- We will let you know if the urine culture results show a different antibiotic is needed in a couple of days ?- We will let you know about the STI testing and vaginal testing tomorrow if anything comes back positive ?- Please make sure you are drinking plenty of water ?- If your symptoms worsen, please seek care ?

## 2021-06-28 NOTE — ED Triage Notes (Signed)
Pt reports for a week she has had pain to vaginal area. ?

## 2021-06-28 NOTE — ED Provider Notes (Signed)
?[facility] ? ? ? ?CSN: [id number] ?Arrival date & time: 06/28/21  1837 ? ? ?  ? ?History   ?Chief Complaint ?Chief Complaint  ?Patient presents with  ? Vaginal Pain  ? ? ?HPI ?Hannah Campbell is a 26 y.o. female.  ? ?Patient reports vaginal pain for the past week.  She also describes lower abdominal cramping and sharp pain.  She denies burning with urination, vaginal discharge, vaginal itching.  She does sometimes have a malodorous vaginal smell.  She is having some increased urinary frequency.  She denies new back pain, fevers, nausea/vomiting.  She denies any blood in her urine.  Last menstrual period was a couple of weeks ago. ? ? ?Past Medical History:  ?Diagnosis Date  ? Medical history non-contributory   ? ? ?Patient Active Problem List  ? Diagnosis Date Noted  ? Post term pregnancy over 40 weeks 11/26/2018  ? Contraception management 10/06/2014  ? ? ?Past Surgical History:  ?Procedure Laterality Date  ? NO PAST SURGERIES    ? ? ?OB History   ? ? Gravida  ?1  ? Para  ?1  ? Term  ?1  ? Preterm  ?   ? AB  ?   ? Living  ?1  ?  ? ? SAB  ?   ? IAB  ?   ? Ectopic  ?   ? Multiple  ?0  ? Live Births  ?1  ?   ?  ?  ? ? ? ?Home Medications   ? ?Prior to Admission medications   ?Medication Sig Start Date End Date Taking? Authorizing Provider  ?cephALEXin (KEFLEX) 500 MG capsule Take 1 capsule (500 mg total) by mouth 4 (four) times daily for 5 days. 06/28/21 07/03/21 Yes Eulogio Bear, NP  ?acetaminophen (TYLENOL) 325 MG tablet Take 2 tablets (650 mg total) by mouth every 6 (six) hours as needed (for pain scale < 4). 11/28/18   Fair, Marin Shutter, MD  ?ibuprofen (ADVIL) 600 MG tablet Take 1 tablet (600 mg total) by mouth every 8 (eight) hours as needed for mild pain. 11/28/18   FairMarin Shutter, MD  ?norethindrone (MICRONOR) 0.35 MG tablet Take 1 tablet (0.35 mg total) by mouth daily. Start taking at 4 weeks postpartum, around 01/22/19 11/29/18   Darlina Rumpf, CNM  ? ? ?Family History ?Family History  ?Problem  Relation Age of Onset  ? Diabetes Maternal Grandmother   ? Hypertension Maternal Grandmother   ? ? ?Social History ?Social History  ? ?Tobacco Use  ? Smoking status: Never  ? Smokeless tobacco: Never  ?Vaping Use  ? Vaping Use: Never used  ?Substance Use Topics  ? Alcohol use: No  ? Drug use: No  ? ? ? ?Allergies   ?Patient has no known allergies. ? ? ?Review of Systems ?Review of Systems ?Per HPI ? ?Physical Exam ?Triage Vital Signs ?ED Triage Vitals  ?Enc Vitals Group  ?   BP 06/28/21 2006 126/77  ?   Pulse Rate 06/28/21 2006 77  ?   Resp 06/28/21 2006 18  ?   Temp 06/28/21 2006 98.9 ?F (37.2 ?C)  ?   Temp src --   ?   SpO2 06/28/21 2006 100 %  ?   Weight --   ?   Height --   ?   Head Circumference --   ?   Peak Flow --   ?   Pain Score 06/28/21 2003 8  ?   Pain  Loc --   ?   Pain Edu? --   ?   Excl. in [place]? --   ? ?No data found. ? ?Updated Vital Signs ?BP 126/77   Pulse 77   Temp 98.9 ?F (37.2 ?C)   Resp 18   LMP 06/16/2021 (Approximate)   SpO2 100%  ? ?Visual Acuity ?Right Eye Distance:   ?Left Eye Distance:   ?Bilateral Distance:   ? ?Right Eye Near:   ?Left Eye Near:    ?Bilateral Near:    ? ?Physical Exam ?Vitals and nursing note reviewed.  ?Constitutional:   ?   General: She is not in acute distress. ?   Appearance: Normal appearance. She is not toxic-appearing.  ?Pulmonary:  ?   Effort: Pulmonary effort is normal. No respiratory distress.  ?Abdominal:  ?   General: Abdomen is flat. Bowel sounds are normal. There is no distension.  ?   Palpations: Abdomen is soft. There is no mass.  ?   Tenderness: There is no abdominal tenderness. There is no right CVA tenderness, left CVA tenderness or guarding.  ?Genitourinary: ?   Comments: deferred ?Skin: ?   General: Skin is warm and dry.  ?   Coloration: Skin is not jaundiced or pale.  ?   Findings: No erythema.  ?Neurological:  ?   Mental Status: She is alert and oriented to person, place, and time.  ?   Motor: No weakness.  ?   Gait: Gait normal.  ?Psychiatric:      ?   Mood and Affect: Mood normal.     ?   Behavior: Behavior normal.     ?   Thought Content: Thought content normal.     ?   Judgment: Judgment normal.  ? ? ? ?UC Treatments / Results  ?Labs ?(all labs ordered are listed, but only abnormal results are displayed) ?Labs Reviewed  ?POCT URINALYSIS DIPSTICK, ED / UC - Abnormal; Notable for the following components:  ?    Result Value  ? Hgb urine dipstick SMALL (*)   ? Protein, ur 30 (*)   ? Leukocytes,Ua SMALL (*)   ? All other components within normal limits  ?URINE CULTURE  ?HIV ANTIBODY (ROUTINE TESTING W REFLEX)  ?RPR  ?POC URINE PREG, ED  ?CERVICOVAGINAL ANCILLARY ONLY  ? ? ?EKG ? ? ?Radiology ?No results found. ? ?Procedures ?Procedures (including critical care time) ? ?Medications Ordered in UC ?Medications - No data to display ? ?Initial Impression / Assessment and Plan / UC Course  ?I have reviewed the triage vital signs and the nursing notes. ? ?Pertinent labs & imaging results that were available during my care of the patient were reviewed by me and considered in my medical decision making (see chart for details). ? ?  ?UA dipstick today shows small amount of hemoglobin, protein, leukocytes.  Given urinary symptoms, will treat patient empirically with Keflex 500 mg 4 times daily for 5 days.  We will send urine for culture.  Check self swab today for bacterial vaginosis, yeast vaginitis, gonorrhea, chlamydia, trichomonas.  Treat as indicated.  Also check HIV and syphilis testing. ?Final Clinical Impressions(s) / UC Diagnoses  ? ?Final diagnoses:  ?Lower abdominal pain  ?Vaginal irritation  ? ? ? ?Discharge Instructions   ? ?  ?- We are treating you for a urinary tract infection ?- We will let you know if the urine culture results show a different antibiotic is needed in a couple of days ?- We will  let you know about the STI testing and vaginal testing tomorrow if anything comes back positive ?- Please make sure you are drinking plenty of water ?- If your  symptoms worsen, please seek care ? ? ?ED Prescriptions   ? ? Medication Sig Dispense Auth. Provider  ? cephALEXin (KEFLEX) 500 MG capsule Take 1 capsule (500 mg total) by mouth 4 (four) times daily for 5 days. 20 capsule Eulogio Bear, NP  ? ?  ? ?PDMP not reviewed this encounter. ?  ?Eulogio Bear, NP ?06/28/21 2119 ? ?

## 2021-06-29 LAB — CERVICOVAGINAL ANCILLARY ONLY
Bacterial Vaginitis (gardnerella): NEGATIVE
Candida Glabrata: NEGATIVE
Candida Vaginitis: NEGATIVE
Chlamydia: NEGATIVE
Comment: NEGATIVE
Comment: NEGATIVE
Comment: NEGATIVE
Comment: NEGATIVE
Comment: NEGATIVE
Comment: NORMAL
Neisseria Gonorrhea: NEGATIVE
Trichomonas: NEGATIVE

## 2021-06-29 LAB — RPR: RPR Ser Ql: NONREACTIVE

## 2021-06-30 LAB — URINE CULTURE: Culture: <10000 — AB

## 2022-01-02 ENCOUNTER — Ambulatory Visit
Admission: EM | Admit: 2022-01-02 | Discharge: 2022-01-02 | Disposition: A | Discharge status: Home / Self Care | Payer: Medicaid Other | Attending: Emergency Medicine | Admitting: Emergency Medicine

## 2022-01-02 DIAGNOSIS — N644 Mastodynia: Secondary | ICD-10-CM

## 2022-01-02 MED ORDER — SULFAMETHOXAZOLE-TRIMETHOPRIM 800-160 MG PO TABS: 2.0000 | ORAL_TABLET | Freq: Two times a day (BID) | ORAL | 0 refills | Status: AC

## 2022-01-02 NOTE — Discharge Instructions (Addendum)
I provided you with an antibiotic to treat you empirically for presumed infection in your right breast, also called cellulitis.  Please take 2 tablets twice daily for the next 5 days.  Please reach out to your primary care provider soon as possible to make an appointment for a mammogram to rule out any other more sinister causes of your breast pain.  Thank you for visiting urgent care today.

## 2022-01-02 NOTE — ED Triage Notes (Signed)
Pt c/o soreness to her right areola that began yesterday. The patient states it feels like pressure.   Home interventions: none

## 2022-01-02 NOTE — ED Provider Notes (Signed)
[facility]    CSN: [id number] Arrival date & time: 01/02/22  1303    HISTORY   Chief Complaint  Patient presents with   Breast Pain   HPI Hannah Campbell is a pleasant, 26 y.o. female who presents to urgent care today. Pt c/o soreness to her right areola that began yesterday. The patient states it feels like pressure.  Patient denies fever, aches, chills, nipple discharge, breast mass.  Patient states she normally has breast tenderness before her period begins however this time her right breast hurts significantly more than the left.   Home interventions: none  The history is provided by the patient.   Past Medical History:  Diagnosis Date   Medical history non-contributory    Patient Active Problem List   Diagnosis Date Noted   Post term pregnancy over 40 weeks 11/26/2018   Contraception management 10/06/2014   Past Surgical History:  Procedure Laterality Date   NO PAST SURGERIES     OB History     Gravida  1   Para  1   Term  1   Preterm      AB      Living  1      SAB      IAB      Ectopic      Multiple  0   Live Births  1          Home Medications    Prior to Admission medications   Medication Sig Start Date End Date Taking? Authorizing Provider  sulfamethoxazole-trimethoprim (BACTRIM DS) 800-160 MG tablet Take 2 tablets by mouth 2 (two) times daily for 5 days. 01/02/22 01/07/22 Yes Lynden Oxford Scales, PA-C  acetaminophen (TYLENOL) 325 MG tablet Take 2 tablets (650 mg total) by mouth every 6 (six) hours as needed (for pain scale < 4). 11/28/18   Fair, Marin Shutter, MD  ibuprofen (ADVIL) 600 MG tablet Take 1 tablet (600 mg total) by mouth every 8 (eight) hours as needed for mild pain. 11/28/18   Fair, Marin Shutter, MD  norethindrone (MICRONOR) 0.35 MG tablet Take 1 tablet (0.35 mg total) by mouth daily. Start taking at 4 weeks postpartum, around 01/22/19 11/29/18   Darlina Rumpf, CNM    Family History Family History  Problem  Relation Age of Onset   Diabetes Maternal Grandmother    Hypertension Maternal Grandmother    Social History Social History   Tobacco Use   Smoking status: Never   Smokeless tobacco: Never  Vaping Use   Vaping Use: Never used  Substance Use Topics   Alcohol use: No   Drug use: No   Allergies   Patient has no known allergies.  Review of Systems Review of Systems Pertinent findings revealed after performing a 14 point review of systems has been noted in the history of present illness.  Physical Exam Triage Vital Signs ED Triage Vitals  Enc Vitals Group     BP 01/22/21 0827 (!) 147/82     Pulse Rate 01/22/21 0827 72     Resp 01/22/21 0827 18     Temp 01/22/21 0827 98.3 F (36.8 C)     Temp Source 01/22/21 0827 Oral     SpO2 01/22/21 0827 98 %     Weight --      Height --      Head Circumference --      Peak Flow --      Pain Score 01/22/21 0826 5  Pain Loc --      Pain Edu? --      Excl. in [place]? --   No data found.  Updated Vital Signs BP 110/79 (BP Location: Left Arm)   Pulse (!) 104   Temp 98.1 F (36.7 C) (Oral)   Resp 16   LMP 12/08/2021 (Approximate)   SpO2 97%   Breastfeeding No   Physical Exam Vitals and nursing note reviewed.  Constitutional:      General: She is not in acute distress.    Appearance: Normal appearance.  HENT:     Head: Normocephalic and atraumatic.  Eyes:     Pupils: Pupils are equal, round, and reactive to light.  Cardiovascular:     Rate and Rhythm: Normal rate and regular rhythm.  Pulmonary:     Effort: Pulmonary effort is normal.     Breath sounds: Normal breath sounds.  Chest:  Breasts:    Tanner Score is 5.     Breasts are symmetrical.     Right: Normal.     Left: Normal.  Musculoskeletal:        General: Normal range of motion.     Cervical back: Normal range of motion and neck supple.  Skin:    General: Skin is warm and dry.  Neurological:     General: No focal deficit present.     Mental Status: She is  alert and oriented to person, place, and time. Mental status is at baseline.  Psychiatric:        Mood and Affect: Mood normal.        Behavior: Behavior normal.        Thought Content: Thought content normal.        Judgment: Judgment normal.     Visual Acuity Right Eye Distance:   Left Eye Distance:   Bilateral Distance:    Right Eye Near:   Left Eye Near:    Bilateral Near:     UC Couse / Diagnostics / Procedures:     Radiology No results found.  Procedures Procedures (including critical care time) EKG  Pending results:  Labs Reviewed - No data to display  Medications Ordered in UC: Medications - No data to display  UC Diagnoses / Final Clinical Impressions(s)   I have reviewed the triage vital signs and the nursing notes.  Pertinent labs & imaging results that were available during my care of the patient were reviewed by me and considered in my medical decision making (see chart for details).    Final diagnoses:  Mastalgia in female   Physical exam today is unremarkable.  Patient advised to reach out to her primary care provider to discuss scheduling mammogram.  Patient provided with a prescription for antibiotics to take for empiric treatment for possible mastitis.  Return precautions advised.  Emergency precautions advised.  ED Prescriptions     Medication Sig Dispense Auth. Provider   sulfamethoxazole-trimethoprim (BACTRIM DS) 800-160 MG tablet Take 2 tablets by mouth 2 (two) times daily for 5 days. 20 tablet Lynden Oxford Scales, PA-C      PDMP not reviewed this encounter.  Pending results:  Labs Reviewed - No data to display  Discharge Instructions:   Discharge Instructions      I provided you with an antibiotic to treat you empirically for presumed infection in your right breast, also called cellulitis.  Please take 2 tablets twice daily for the next 5 days.  Please reach out to your primary care provider soon  as possible to make an  appointment for a mammogram to rule out any other more sinister causes of your breast pain.  Thank you for visiting urgent care today.    Disposition Upon Discharge:  Condition: stable for discharge home  Patient presented with an acute illness with associated systemic symptoms and significant discomfort requiring urgent management. In my opinion, this is a condition that a prudent lay person (someone who possesses an average knowledge of health and medicine) may potentially expect to result in complications if not addressed urgently such as respiratory distress, impairment of bodily function or dysfunction of bodily organs.   Routine symptom specific, illness specific and/or disease specific instructions were discussed with the patient and/or caregiver at length.   As such, the patient has been evaluated and assessed, work-up was performed and treatment was provided in alignment with urgent care protocols and evidence based medicine.  Patient/parent/caregiver has been advised that the patient may require follow up for further testing and treatment if the symptoms continue in spite of treatment, as clinically indicated and appropriate.  Patient/parent/caregiver has been advised to return to the [facility] or PCP if no better; to PCP or the Emergency Department if new signs and symptoms develop, or if the current signs or symptoms continue to change or worsen for further workup, evaluation and treatment as clinically indicated and appropriate  The patient will follow up with their current PCP if and as advised. If the patient does not currently have a PCP we will assist them in obtaining one.   The patient may need specialty follow up if the symptoms continue, in spite of conservative treatment and management, for further workup, evaluation, consultation and treatment as clinically indicated and appropriate.   Patient/parent/caregiver verbalized understanding and agreement of plan as discussed.  All  questions were addressed during visit.  Please see discharge instructions below for further details of plan.  This office note has been dictated using Museum/gallery curator.  Unfortunately, this method of dictation can sometimes lead to typographical or grammatical errors.  I apologize for your inconvenience in advance if this occurs.  Please do not hesitate to reach out to me if clarification is needed.      Lynden Oxford Scales, PA-C 01/03/22 1304

## 2022-01-05 ENCOUNTER — Ambulatory Visit: Payer: Medicaid Other | Admitting: Family Medicine

## 2022-01-11 ENCOUNTER — Encounter: Payer: 59 | Admitting: Radiology

## 2022-01-14 ENCOUNTER — Ambulatory Visit
Admission: EM | Admit: 2022-01-14 | Discharge: 2022-01-14 | Disposition: A | Discharge status: Home / Self Care | Payer: No Typology Code available for payment source | Attending: Emergency Medicine | Admitting: Emergency Medicine

## 2022-01-14 DIAGNOSIS — J309 Allergic rhinitis, unspecified: Secondary | ICD-10-CM

## 2022-01-14 DIAGNOSIS — H66001 Acute suppurative otitis media without spontaneous rupture of ear drum, right ear: Secondary | ICD-10-CM | POA: Diagnosis not present

## 2022-01-14 DIAGNOSIS — H1013 Acute atopic conjunctivitis, bilateral: Secondary | ICD-10-CM | POA: Diagnosis not present

## 2022-01-14 MED ORDER — FLUTICASONE PROPIONATE 50 MCG/ACT NA SUSP: 1.0000 | Freq: Every day | NASAL | 2 refills | Status: AC

## 2022-01-14 MED ORDER — CEFDINIR 300 MG PO CAPS: 300.0000 mg | ORAL_CAPSULE | Freq: Two times a day (BID) | ORAL | 0 refills | Status: AC

## 2022-01-14 MED ORDER — CETIRIZINE HCL 10 MG PO TABS: 10.0000 mg | ORAL_TABLET | Freq: Every day | ORAL | 1 refills | Status: DC

## 2022-01-14 MED ORDER — OLOPATADINE HCL 0.2 % OP SOLN: 1.0000 [drp] | Freq: Every day | OPHTHALMIC | 1 refills | Status: DC

## 2022-01-14 NOTE — ED Provider Notes (Signed)
[facility]    CSN: [id number] Arrival date & time: 01/14/22  1049    HISTORY   Chief Complaint  Patient presents with   Conjunctivitis   HPI Hannah Campbell is a pleasant, 26 y.o. female who presents to urgent care today. The patient c/o right eye redness, crusting and itchiness that started yesterday. Home interventions: previously prescribed antibiotic eye drop which has been ineffective.  Patient denies history of allergies and asthma.  Patient denies vision changes, eye pain, trauma to her eye.  The history is provided by the patient.   Past Medical History:  Diagnosis Date   Medical history non-contributory    Patient Active Problem List   Diagnosis Date Noted   Post term pregnancy over 40 weeks 11/26/2018   Contraception management 10/06/2014   Past Surgical History:  Procedure Laterality Date   NO PAST SURGERIES     OB History     Gravida  1   Para  1   Term  1   Preterm      AB      Living  1      SAB      IAB      Ectopic      Multiple  0   Live Births  1          Home Medications    Prior to Admission medications   Medication Sig Start Date End Date Taking? Authorizing Provider  acetaminophen (TYLENOL) 325 MG tablet Take 2 tablets (650 mg total) by mouth every 6 (six) hours as needed (for pain scale < 4). 11/28/18   Fair, Hoyle Sauer, MD  ibuprofen (ADVIL) 600 MG tablet Take 1 tablet (600 mg total) by mouth every 8 (eight) hours as needed for mild pain. 11/28/18   Fair, Hoyle Sauer, MD  norethindrone (MICRONOR) 0.35 MG tablet Take 1 tablet (0.35 mg total) by mouth daily. Start taking at 4 weeks postpartum, around 01/22/19 11/29/18   Calvert Cantor, CNM    Family History Family History  Problem Relation Age of Onset   Diabetes Maternal Grandmother    Hypertension Maternal Grandmother    Social History Social History   Tobacco Use   Smoking status: Never   Smokeless tobacco: Never  Vaping Use   Vaping Use: Never used   Substance Use Topics   Alcohol use: No   Drug use: No   Allergies   Patient has no known allergies.  Review of Systems Review of Systems Pertinent findings revealed after performing a 14 point review of systems has been noted in the history of present illness.  Physical Exam Triage Vital Signs ED Triage Vitals  Enc Vitals Group     BP 01/22/21 0827 (!) 147/82     Pulse Rate 01/22/21 0827 72     Resp 01/22/21 0827 18     Temp 01/22/21 0827 98.3 F (36.8 C)     Temp Source 01/22/21 0827 Oral     SpO2 01/22/21 0827 98 %     Weight --      Height --      Head Circumference --      Peak Flow --      Pain Score 01/22/21 0826 5     Pain Loc --      Pain Edu? --      Excl. in GC? --   No data found.  Updated Vital Signs BP 139/78 (BP Location: Left Arm)   Pulse 72  Temp 98.5 F (36.9 C) (Oral)   Resp 18   Wt 269 lb 9.6 oz (122.3 kg)   LMP 12/08/2021 (Approximate)   SpO2 98%   BMI 46.28 kg/m   Physical Exam Vitals and nursing note reviewed.  Constitutional:      General: She is not in acute distress.    Appearance: Normal appearance. She is not ill-appearing.  HENT:     Head: Normocephalic and atraumatic.     Salivary Glands: Right salivary gland is not diffusely enlarged or tender. Left salivary gland is not diffusely enlarged or tender.     Right Ear: Hearing, ear canal and external ear normal. No drainage. A middle ear effusion (Suppurative) is present. There is no impacted cerumen. Tympanic membrane is injected, erythematous and retracted.     Left Ear: Hearing, ear canal and external ear normal. No drainage. A middle ear effusion (Clear) is present. There is no impacted cerumen. Tympanic membrane is bulging. Tympanic membrane is not injected or erythematous.     Ears:     Comments: Bilateral EACs normal, both TMs bulging with clear fluid    Nose: Rhinorrhea present. No nasal deformity, septal deviation, signs of injury, nasal tenderness, mucosal edema or  congestion. Rhinorrhea is clear.     Right Nostril: Occlusion present. No foreign body, epistaxis or septal hematoma.     Left Nostril: Occlusion present. No foreign body, epistaxis or septal hematoma.     Right Turbinates: Enlarged, swollen and pale.     Left Turbinates: Enlarged, swollen and pale.     Right Sinus: No maxillary sinus tenderness or frontal sinus tenderness.     Left Sinus: No maxillary sinus tenderness or frontal sinus tenderness.     Mouth/Throat:     Lips: Pink. No lesions.     Mouth: Mucous membranes are moist. No oral lesions.     Pharynx: Oropharynx is clear. Uvula midline. No posterior oropharyngeal erythema or uvula swelling.     Tonsils: No tonsillar exudate. 0 on the right. 0 on the left.     Comments: Postnasal drip Eyes:     General: Lids are normal. Lids are everted, no foreign bodies appreciated. Vision grossly intact. Gaze aligned appropriately. No allergic shiner.       Right eye: No foreign body, discharge or hordeolum.        Left eye: No foreign body, discharge or hordeolum.     Extraocular Movements: Extraocular movements intact.     Conjunctiva/sclera: Conjunctivae normal.     Right eye: Right conjunctiva is not injected. No exudate.    Left eye: Left conjunctiva is not injected. No exudate.    Comments: Mild crusting appreciated in right eyelashes  Neck:     Trachea: Trachea and phonation normal.  Cardiovascular:     Rate and Rhythm: Normal rate and regular rhythm.     Pulses: Normal pulses.     Heart sounds: Normal heart sounds. No murmur heard.    No friction rub. No gallop.  Pulmonary:     Effort: Pulmonary effort is normal. No accessory muscle usage, prolonged expiration or respiratory distress.     Breath sounds: Normal breath sounds. No stridor, decreased air movement or transmitted upper airway sounds. No decreased breath sounds, wheezing, rhonchi or rales.  Chest:     Chest wall: No tenderness.  Musculoskeletal:        General: Normal  range of motion.     Cervical back: Normal range of motion and neck supple.  Normal range of motion.  Lymphadenopathy:     Cervical: No cervical adenopathy.  Skin:    General: Skin is warm and dry.     Findings: No erythema or rash.  Neurological:     General: No focal deficit present.     Mental Status: She is alert and oriented to person, place, and time.  Psychiatric:        Mood and Affect: Mood normal.        Behavior: Behavior normal.     Visual Acuity Right Eye Distance:   Left Eye Distance:   Bilateral Distance:    Right Eye Near:   Left Eye Near:    Bilateral Near:     UC Couse / Diagnostics / Procedures:     Radiology No results found.  Procedures Procedures (including critical care time) EKG  Pending results:  Labs Reviewed - No data to display  Medications Ordered in UC: Medications - No data to display  UC Diagnoses / Final Clinical Impressions(s)   I have reviewed the triage vital signs and the nursing notes.  Pertinent labs & imaging results that were available during my care of the patient were reviewed by me and considered in my medical decision making (see chart for details).    Final diagnoses:  Allergic conjunctivitis and rhinitis, bilateral  Acute suppurative otitis media of right ear   Patient provided with a prescription for cefdinir for the infection in her right ear.  Patient advised that the infection is likely due to undercontrolled allergies.  Patient provided with Flonase, Zyrtec and Pataday for allergy treatment.  Patient advised to continue Zyrtec daily.  Return precautions advised.  ED Prescriptions     Medication Sig Dispense Auth. Provider   cefdinir (OMNICEF) 300 MG capsule Take 1 capsule (300 mg total) by mouth 2 (two) times daily for 10 days. 20 capsule Theadora RamaMorgan, Raynald Rouillard Scales, PA-C   cetirizine (ZYRTEC ALLERGY) 10 MG tablet Take 1 tablet (10 mg total) by mouth at bedtime. 90 tablet Theadora RamaMorgan, Dex Blakely Scales, PA-C   fluticasone  (FLONASE) 50 MCG/ACT nasal spray Place 1 spray into both nostrils daily. Begin by using 2 sprays in each nare daily for 3 to 5 days, then decrease to 1 spray in each nare daily. 15.8 mL Theadora RamaMorgan, Frances Ambrosino Scales, PA-C   Olopatadine HCl (PATADAY) 0.2 % SOLN Apply 1 drop to eye daily. 2.5 mL Theadora RamaMorgan, Keanu Frickey Scales, PA-C      PDMP not reviewed this encounter.  Pending results:  Labs Reviewed - No data to display  Discharge Instructions:   Discharge Instructions      Your symptoms and my physical exam findings are concerning for exacerbation of your underlying allergies which have caused you to develop a bacterial infection in your right inner ear as well.     Please see the list below for recommended medications, dosages and frequencies to provide relief of current symptoms:    Zyrtec (cetirizine): This is an excellent second-generation antihistamine that helps to reduce respiratory inflammatory response to environmental allergens.  In some patients, this medication can cause daytime sleepiness so I recommend that you take 1 tablet daily at bedtime.     Flonase (fluticasone): This is a steroid nasal spray that you use once daily, 1 spray in each nare.  This medication does not work well if you decide to use it only used as you feel you need to, it works best used on a daily basis.  After 3 to 5 days of  use, you will notice significant reduction of the inflammation and mucus production that is currently being caused by exposure to allergens, whether seasonal or environmental.  The most common side effect of this medication is nosebleeds.  If you experience a nosebleed, please discontinue use for 1 week, then feel free to resume.  I have provided you with a prescription.     Omnicef (cefdinir):  1 capsule twice daily for 10 days, you can take it with or without food.  This antibiotic can cause upset stomach, this will resolve once antibiotics are complete.  You are welcome to use a probiotic, eat  yogurt, take Imodium while taking this medication.  Please avoid other systemic medications such as Maalox, Pepto-Bismol or milk of magnesia as they can interfere with your body's ability to absorb the antibiotics.  Pataday (olopatadine): This is an antihistamine eyedrop that can be used once daily to help relieve dry eyes, itchy eyes and red eyes.  This antihistamine drop not only works for allergic conjunctivitis but is also very helpful with viral conjunctivitis.  Please do not use this drop more than once a day, for best relief please use this in the morning.        It is very important that you take antibiotics as prescribed.  If you skip doses or do not complete the full course of antibiotics, you put yourself at significant risk of recurrent infection which can often be worse than your initial infection.           Disposition Upon Discharge:  Condition: stable for discharge home  Patient presented with an acute illness with associated systemic symptoms and significant discomfort requiring urgent management. In my opinion, this is a condition that a prudent lay person (someone who possesses an average knowledge of health and medicine) may potentially expect to result in complications if not addressed urgently such as respiratory distress, impairment of bodily function or dysfunction of bodily organs.   Routine symptom specific, illness specific and/or disease specific instructions were discussed with the patient and/or caregiver at length.   As such, the patient has been evaluated and assessed, work-up was performed and treatment was provided in alignment with urgent care protocols and evidence based medicine.  Patient/parent/caregiver has been advised that the patient may require follow up for further testing and treatment if the symptoms continue in spite of treatment, as clinically indicated and appropriate.  Patient/parent/caregiver has been advised to return to the [facility] or PCP if no  better; to PCP or the Emergency Department if new signs and symptoms develop, or if the current signs or symptoms continue to change or worsen for further workup, evaluation and treatment as clinically indicated and appropriate  The patient will follow up with their current PCP if and as advised. If the patient does not currently have a PCP we will assist them in obtaining one.   The patient may need specialty follow up if the symptoms continue, in spite of conservative treatment and management, for further workup, evaluation, consultation and treatment as clinically indicated and appropriate.   Patient/parent/caregiver verbalized understanding and agreement of plan as discussed.  All questions were addressed during visit.  Please see discharge instructions below for further details of plan.  This office note has been dictated using Teaching laboratory technician.  Unfortunately, this method of dictation can sometimes lead to typographical or grammatical errors.  I apologize for your inconvenience in advance if this occurs.  Please do not hesitate to reach out to  me if clarification is needed.      Lynden Oxford Scales, PA-C 01/14/22 1248

## 2022-01-14 NOTE — Discharge Instructions (Addendum)
Your symptoms and my physical exam findings are concerning for exacerbation of your underlying allergies which have caused you to develop a bacterial infection in your right inner ear as well.     Please see the list below for recommended medications, dosages and frequencies to provide relief of current symptoms:    Zyrtec (cetirizine): This is an excellent second-generation antihistamine that helps to reduce respiratory inflammatory response to environmental allergens.  In some patients, this medication can cause daytime sleepiness so I recommend that you take 1 tablet daily at bedtime.     Flonase (fluticasone): This is a steroid nasal spray that you use once daily, 1 spray in each nare.  This medication does not work well if you decide to use it only used as you feel you need to, it works best used on a daily basis.  After 3 to 5 days of use, you will notice significant reduction of the inflammation and mucus production that is currently being caused by exposure to allergens, whether seasonal or environmental.  The most common side effect of this medication is nosebleeds.  If you experience a nosebleed, please discontinue use for 1 week, then feel free to resume.  I have provided you with a prescription.     Omnicef (cefdinir):  1 capsule twice daily for 10 days, you can take it with or without food.  This antibiotic can cause upset stomach, this will resolve once antibiotics are complete.  You are welcome to use a probiotic, eat yogurt, take Imodium while taking this medication.  Please avoid other systemic medications such as Maalox, Pepto-Bismol or milk of magnesia as they can interfere with your body's ability to absorb the antibiotics.  Pataday (olopatadine): This is an antihistamine eyedrop that can be used once daily to help relieve dry eyes, itchy eyes and red eyes.  This antihistamine drop not only works for allergic conjunctivitis but is also very helpful with viral conjunctivitis.  Please do  not use this drop more than once a day, for best relief please use this in the morning.        It is very important that you take antibiotics as prescribed.  If you skip doses or do not complete the full course of antibiotics, you put yourself at significant risk of recurrent infection which can often be worse than your initial infection.

## 2022-01-14 NOTE — ED Triage Notes (Signed)
The patient c/o right eye redness, crusting and itchiness that started yesterday.   Home interventions: previously prescribed antibiotic eye drop

## 2022-01-21 ENCOUNTER — Encounter: Payer: No Typology Code available for payment source | Admitting: Radiology

## 2022-01-31 ENCOUNTER — Ambulatory Visit
Admission: EM | Admit: 2022-01-31 | Discharge: 2022-01-31 | Disposition: A | Discharge status: Home / Self Care | Payer: No Typology Code available for payment source | Attending: Urgent Care | Admitting: Urgent Care

## 2022-01-31 DIAGNOSIS — K529 Noninfective gastroenteritis and colitis, unspecified: Secondary | ICD-10-CM

## 2022-01-31 MED ORDER — ONDANSETRON 8 MG PO TBDP
8.0000 mg | ORAL_TABLET | Freq: Once | ORAL | Status: AC
  Administered 2022-01-31: 8 mg via ORAL

## 2022-01-31 MED ORDER — ONDANSETRON 8 MG PO TBDP: 8.0000 mg | ORAL_TABLET | Freq: Three times a day (TID) | ORAL | 0 refills | Status: AC | PRN

## 2022-01-31 MED ORDER — LOPERAMIDE HCL 2 MG PO CAPS: 2.0000 mg | ORAL_CAPSULE | Freq: Two times a day (BID) | ORAL | 0 refills | Status: DC | PRN

## 2022-01-31 NOTE — Discharge Instructions (Signed)

## 2022-01-31 NOTE — ED Triage Notes (Signed)
Patient presents to UC for diarrhea since last night. Has not taken anything.   Denies fever.

## 2022-01-31 NOTE — ED Provider Notes (Signed)
[facility]  Note:  This document was prepared using Conservation officer, historic buildings and may include unintentional dictation errors.  MRN: [id number] DOB: 12-03-1995  Subjective:   Hannah Campbell is a 26 y.o. female presenting for 1 day history of cute onset persistent watery diarrhea.  Patient completed a course of cefdinir about a week ago. No fever, bloody stools, hospitalizations or long distance travel.  Has not eaten raw foods, drank unfiltered water.  No history of GI disorders including Crohn's, IBS, ulcerative colitis.  No fever, body aches, abdominal pain, chest pain, shortness of breath.   No current facility-administered medications for this encounter.  Current Outpatient Medications:    acetaminophen (TYLENOL) 325 MG tablet, Take 2 tablets (650 mg total) by mouth every 6 (six) hours as needed (for pain scale < 4)., Disp: 60 tablet, Rfl: 1   cetirizine (ZYRTEC ALLERGY) 10 MG tablet, Take 1 tablet (10 mg total) by mouth at bedtime., Disp: 90 tablet, Rfl: 1   fluticasone (FLONASE) 50 MCG/ACT nasal spray, Place 1 spray into both nostrils daily. Begin by using 2 sprays in each nare daily for 3 to 5 days, then decrease to 1 spray in each nare daily., Disp: 15.8 mL, Rfl: 2   ibuprofen (ADVIL) 600 MG tablet, Take 1 tablet (600 mg total) by mouth every 8 (eight) hours as needed for mild pain., Disp: 30 tablet, Rfl: 0   norethindrone (MICRONOR) 0.35 MG tablet, Take 1 tablet (0.35 mg total) by mouth daily. Start taking at 4 weeks postpartum, around 01/22/19, Disp: 1 Package, Rfl: 11   Olopatadine HCl (PATADAY) 0.2 % SOLN, Apply 1 drop to eye daily., Disp: 2.5 mL, Rfl: 1   No Known Allergies  Past Medical History:  Diagnosis Date   Medical history non-contributory      Past Surgical History:  Procedure Laterality Date   NO PAST SURGERIES      Family History  Problem Relation Age of Onset   Diabetes Maternal Grandmother    Hypertension Maternal Grandmother      Social History   Tobacco Use   Smoking status: Never   Smokeless tobacco: Never  Vaping Use   Vaping Use: Never used  Substance Use Topics   Alcohol use: No   Drug use: No    ROS   Objective:   Vitals: BP 120/72 (BP Location: Left Arm)   Pulse (!) 103   Temp 97.8 F (36.6 C) (Oral)   Resp 16   LMP 01/16/2022 (Approximate)   SpO2 98%   Physical Exam Constitutional:      General: She is not in acute distress.    Appearance: Normal appearance. She is well-developed. She is not ill-appearing, toxic-appearing or diaphoretic.  HENT:     Head: Normocephalic and atraumatic.     Nose: Nose normal.     Mouth/Throat:     Mouth: Mucous membranes are moist.  Eyes:     General: No scleral icterus.       Right eye: No discharge.        Left eye: No discharge.     Extraocular Movements: Extraocular movements intact.     Conjunctiva/sclera: Conjunctivae normal.  Cardiovascular:     Rate and Rhythm: Normal rate.  Pulmonary:     Effort: Pulmonary effort is normal.  Abdominal:     General: Bowel sounds are increased. There is no distension.     Palpations: Abdomen is soft. There is no mass.     Tenderness:  There is generalized abdominal tenderness. There is no right CVA tenderness, left CVA tenderness, guarding or rebound.  Skin:    General: Skin is warm and dry.  Neurological:     General: No focal deficit present.     Mental Status: She is alert and oriented to person, place, and time.  Psychiatric:        Mood and Affect: Mood normal.        Behavior: Behavior normal.        Thought Content: Thought content normal.        Judgment: Judgment normal.    P.o. Zofran given in clinic at 8 mg.  Assessment and Plan :   PDMP not reviewed this encounter.  1. Colitis     Will manage for suspected viral colitis with supportive care.  Recommended patient hydrate well, eat light meals and maintain electrolytes.  Will use Zofran and Imodium for nausea, vomiting and  diarrhea. Counseled patient on potential for adverse effects with medications prescribed/recommended today, ER and return-to-clinic precautions discussed, patient verbalized understanding.    Jaynee Eagles, [place] 01/31/22 1932

## 2022-08-11 ENCOUNTER — Ambulatory Visit
Admission: EM | Admit: 2022-08-11 | Discharge: 2022-08-11 | Disposition: A | Discharge status: Home / Self Care | Payer: No Typology Code available for payment source | Attending: Urgent Care | Admitting: Urgent Care

## 2022-08-11 DIAGNOSIS — B9789 Other viral agents as the cause of diseases classified elsewhere: Secondary | ICD-10-CM

## 2022-08-11 DIAGNOSIS — R0789 Other chest pain: Secondary | ICD-10-CM

## 2022-08-11 DIAGNOSIS — J988 Other specified respiratory disorders: Secondary | ICD-10-CM | POA: Diagnosis not present

## 2022-08-11 DIAGNOSIS — J309 Allergic rhinitis, unspecified: Secondary | ICD-10-CM

## 2022-08-11 MED ORDER — FAMOTIDINE 20 MG PO TABS: 20.0000 mg | ORAL_TABLET | Freq: Two times a day (BID) | ORAL | 0 refills | Status: DC

## 2022-08-11 MED ORDER — PSEUDOEPHEDRINE HCL 60 MG PO TABS: 60.0000 mg | ORAL_TABLET | Freq: Three times a day (TID) | ORAL | 0 refills | Status: DC | PRN

## 2022-08-11 MED ORDER — IBUPROFEN 600 MG PO TABS: 600.0000 mg | ORAL_TABLET | Freq: Three times a day (TID) | ORAL | 0 refills | Status: DC | PRN

## 2022-08-11 NOTE — ED Triage Notes (Signed)
Pt presents with c/o chest pressure  in the middle of her chest since Monday. Pt states she recently had a cough. Pt states she has chest pain after eating.   Home interventions: none.

## 2022-08-11 NOTE — ED Provider Notes (Signed)
[facility]  Note:  This document was prepared using Conservation officer, historic buildings and may include unintentional dictation errors.  MRN: [id number] DOB: 1996-02-01  Subjective:   Ayleth Stelzner is a 27 y.o. female presenting for 4 day history of mid chest discomfort, pressure. Symptoms elicited with eating and drinking as well like heartburn symptoms. Has previously had to be managed for GERD. Last week had throat pain, upset stomach, resolved on its own. No coughing, fever, shob, wheezing. No history of asthma. No smoking of any kind including cigarettes, cigars, vaping, marijuana use.    No current facility-administered medications for this encounter.  Current Outpatient Medications:    acetaminophen (TYLENOL) 325 MG tablet, Take 2 tablets (650 mg total) by mouth every 6 (six) hours as needed (for pain scale < 4)., Disp: 60 tablet, Rfl: 1   cetirizine (ZYRTEC ALLERGY) 10 MG tablet, Take 1 tablet (10 mg total) by mouth at bedtime., Disp: 90 tablet, Rfl: 1   fluticasone (FLONASE) 50 MCG/ACT nasal spray, Place 1 spray into both nostrils daily. Begin by using 2 sprays in each nare daily for 3 to 5 days, then decrease to 1 spray in each nare daily., Disp: 15.8 mL, Rfl: 2   ibuprofen (ADVIL) 600 MG tablet, Take 1 tablet (600 mg total) by mouth every 8 (eight) hours as needed for mild pain., Disp: 30 tablet, Rfl: 0   loperamide (IMODIUM) 2 MG capsule, Take 1 capsule (2 mg total) by mouth 2 (two) times daily as needed for diarrhea or loose stools., Disp: 14 capsule, Rfl: 0   norethindrone (MICRONOR) 0.35 MG tablet, Take 1 tablet (0.35 mg total) by mouth daily. Start taking at 4 weeks postpartum, around 01/22/19, Disp: 1 Package, Rfl: 11   Olopatadine HCl (PATADAY) 0.2 % SOLN, Apply 1 drop to eye daily., Disp: 2.5 mL, Rfl: 1   ondansetron (ZOFRAN-ODT) 8 MG disintegrating tablet, Take 1 tablet (8 mg total) by mouth every 8 (eight) hours as needed for nausea or vomiting., Disp:  20 tablet, Rfl: 0   No Known Allergies  Past Medical History:  Diagnosis Date   Medical history non-contributory      Past Surgical History:  Procedure Laterality Date   NO PAST SURGERIES      Family History  Problem Relation Age of Onset   Diabetes Maternal Grandmother    Hypertension Maternal Grandmother     Social History   Tobacco Use   Smoking status: Never   Smokeless tobacco: Never  Vaping Use   Vaping Use: Never used  Substance Use Topics   Alcohol use: No   Drug use: No    ROS   Objective:   Vitals: BP 115/80 (BP Location: Right Arm)   Pulse 67   Temp 97.8 F (36.6 C) (Oral)   Resp 18   LMP 07/27/2022 (Exact Date)   SpO2 96%   Physical Exam Constitutional:      General: She is not in acute distress.    Appearance: Normal appearance. She is well-developed. She is not ill-appearing, toxic-appearing or diaphoretic.  HENT:     Head: Normocephalic and atraumatic.     Right Ear: External ear normal.     Left Ear: External ear normal.     Nose: Nose normal.     Mouth/Throat:     Mouth: Mucous membranes are moist.     Pharynx: No pharyngeal swelling, oropharyngeal exudate, posterior oropharyngeal erythema or uvula swelling.     Tonsils: No tonsillar  exudate or tonsillar abscesses. 0 on the right. 0 on the left.  Eyes:     General: No scleral icterus.       Right eye: No discharge.        Left eye: No discharge.     Extraocular Movements: Extraocular movements intact.  Cardiovascular:     Rate and Rhythm: Normal rate and regular rhythm.     Heart sounds: Normal heart sounds. No murmur heard.    No friction rub. No gallop.  Pulmonary:     Effort: Pulmonary effort is normal. No respiratory distress.     Breath sounds: No stridor. No wheezing, rhonchi or rales.  Chest:     Chest wall: No tenderness.  Skin:    General: Skin is warm and dry.  Neurological:     General: No focal deficit present.     Mental Status: She is alert and oriented to  person, place, and time.  Psychiatric:        Mood and Affect: Mood normal.        Behavior: Behavior normal.     Assessment and Plan :   PDMP not reviewed this encounter.  1. Viral respiratory illness   2. Atypical chest pain   3. Allergic rhinitis, unspecified seasonality, unspecified trigger    Patient is very well-appearing and recommended general supportive care for a lingering viral respiratory illness.  Offered imaging but she declined.  Maintain Zyrtec, add pseudoephedrine, ibuprofen for pain relief.  Use famotidine for recurrent GERD. Counseled patient on potential for adverse effects with medications prescribed/recommended today, ER and return-to-clinic precautions discussed, patient verbalized understanding.    Wallis Bamberg, [place] 08/11/22 4098

## 2022-08-23 ENCOUNTER — Other Ambulatory Visit: Payer: Self-pay

## 2022-08-23 ENCOUNTER — Encounter (HOSPITAL_BASED_OUTPATIENT_CLINIC_OR_DEPARTMENT_OTHER): Payer: Self-pay

## 2022-08-23 ENCOUNTER — Emergency Department (HOSPITAL_BASED_OUTPATIENT_CLINIC_OR_DEPARTMENT_OTHER): Payer: No Typology Code available for payment source

## 2022-08-23 ENCOUNTER — Emergency Department (HOSPITAL_BASED_OUTPATIENT_CLINIC_OR_DEPARTMENT_OTHER)
Admission: EM | Admit: 2022-08-23 | Discharge: 2022-08-23 | Disposition: A | Discharge status: Home / Self Care | Payer: No Typology Code available for payment source | Attending: Emergency Medicine | Admitting: Emergency Medicine

## 2022-08-23 DIAGNOSIS — R0789 Other chest pain: Secondary | ICD-10-CM | POA: Diagnosis present

## 2022-08-23 DIAGNOSIS — R079 Chest pain, unspecified: Secondary | ICD-10-CM

## 2022-08-23 LAB — CBC WITH DIFFERENTIAL/PLATELET
Abs Immature Granulocytes: 0.03 10*3/uL (ref 0.00–0.07)
Basophils Absolute: 0 10*3/uL (ref 0.0–0.1)
Basophils Relative: 0 %
Eosinophils Absolute: 0.3 10*3/uL (ref 0.0–0.5)
Eosinophils Relative: 3 %
HCT: 36.5 % (ref 36.0–46.0)
Hemoglobin: 12.3 g/dL (ref 12.0–15.0)
Immature Granulocytes: 0 %
Lymphocytes Relative: 36 %
Lymphs Abs: 3.1 10*3/uL (ref 0.7–4.0)
MCH: 29.9 pg (ref 26.0–34.0)
MCHC: 33.7 g/dL (ref 30.0–36.0)
MCV: 88.8 fL (ref 80.0–100.0)
Monocytes Absolute: 0.5 10*3/uL (ref 0.1–1.0)
Monocytes Relative: 5 %
Neutro Abs: 4.9 10*3/uL (ref 1.7–7.7)
Neutrophils Relative %: 56 %
Platelets: 306 10*3/uL (ref 150–400)
RBC: 4.11 MIL/uL (ref 3.87–5.11)
RDW: 12.2 % (ref 11.5–15.5)
WBC: 8.8 10*3/uL (ref 4.0–10.5)
nRBC: 0 % (ref 0.0–0.2)

## 2022-08-23 LAB — COMPREHENSIVE METABOLIC PANEL
ALT: 16 U/L (ref 0–44)
AST: 45 U/L — ABNORMAL HIGH (ref 15–41)
Albumin: 4 g/dL (ref 3.5–5.0)
Alkaline Phosphatase: 41 U/L (ref 38–126)
Anion gap: 7 (ref 5–15)
BUN: 13 mg/dL (ref 6–20)
CO2: 23 mmol/L (ref 22–32)
Calcium: 8.7 mg/dL — ABNORMAL LOW (ref 8.9–10.3)
Chloride: 104 mmol/L (ref 98–111)
Creatinine, Ser: 0.88 mg/dL (ref 0.44–1.00)
GFR, Estimated: >60 mL/min (ref >60)
Glucose, Bld: 102 mg/dL — ABNORMAL HIGH (ref 70–99)
Potassium: 3.7 mmol/L (ref 3.5–5.1)
Sodium: 134 mmol/L — ABNORMAL LOW (ref 135–145)
Total Bilirubin: 0.3 mg/dL (ref 0.3–1.2)
Total Protein: 7.4 g/dL (ref 6.5–8.1)

## 2022-08-23 LAB — LIPASE, BLOOD: Lipase: 44 U/L (ref 11–51)

## 2022-08-23 LAB — TROPONIN I (HIGH SENSITIVITY): Troponin I (High Sensitivity): 4 ng/L (ref <18)

## 2022-08-23 MED ORDER — PANTOPRAZOLE SODIUM 40 MG PO TBEC: 40.0000 mg | DELAYED_RELEASE_TABLET | Freq: Every day | ORAL | 0 refills | Status: AC

## 2022-08-23 MED ORDER — FAMOTIDINE 20 MG PO TABS: 20.0000 mg | ORAL_TABLET | Freq: Two times a day (BID) | ORAL | 0 refills | Status: AC

## 2022-08-23 MED ORDER — PANTOPRAZOLE SODIUM 40 MG PO TBEC
40.0000 mg | DELAYED_RELEASE_TABLET | Freq: Once | ORAL | Status: AC
  Administered 2022-08-23: 40 mg via ORAL
  Filled 2022-08-23: qty 1

## 2022-08-23 MED ORDER — ALUM & MAG HYDROXIDE-SIMETH 200-200-20 MG/5ML PO SUSP
30.0000 mL | Freq: Once | ORAL | Status: AC
  Administered 2022-08-23: 30 mL via ORAL
  Filled 2022-08-23: qty 30

## 2022-08-23 MED ORDER — LIDOCAINE VISCOUS HCL 2 % MT SOLN
15.0000 mL | Freq: Once | OROMUCOSAL | Status: AC
  Administered 2022-08-23: 15 mL via ORAL
  Filled 2022-08-23: qty 15

## 2022-08-23 MED ORDER — SUCRALFATE 1 GM/10ML PO SUSP: 1.0000 g | Freq: Three times a day (TID) | ORAL | 0 refills | Status: AC

## 2022-08-23 NOTE — ED Provider Notes (Signed)
[place] Provider Note   CSN: [id number] Arrival date & time: 08/23/22  0236     History  Chief Complaint  Patient presents with   Chest Pain    Hannah Campbell is a 27 y.o. female.  A week of intermittent chest pressure that seems to be associated with eating but pepcid and ibuprofen recommended by UC didn't seem to help. Had it again tonight and was scared she might not wake up so came here. No cough, fever, emesis.    Chest Pain      Home Medications Prior to Admission medications   Medication Sig Start Date End Date Taking? Authorizing Provider  famotidine (PEPCID) 20 MG tablet Take 1 tablet (20 mg total) by mouth 2 (two) times daily for 10 days. 08/23/22 09/02/22 Yes Chaynce Schafer, Barbara Cower, MD  pantoprazole (PROTONIX) 40 MG tablet Take 1 tablet (40 mg total) by mouth daily. 08/23/22  Yes Remmie Bembenek, Barbara Cower, MD  sucralfate (CARAFATE) 1 GM/10ML suspension Take 10 mLs (1 g total) by mouth 4 (four) times daily -  with meals and at bedtime. 08/23/22  Yes Shahidah Nesbitt, Barbara Cower, MD  acetaminophen (TYLENOL) 325 MG tablet Take 2 tablets (650 mg total) by mouth every 6 (six) hours as needed (for pain scale < 4). 11/28/18   Fair, Hoyle Sauer, MD  fluticasone (FLONASE) 50 MCG/ACT nasal spray Place 1 spray into both nostrils daily. Begin by using 2 sprays in each nare daily for 3 to 5 days, then decrease to 1 spray in each nare daily. 01/14/22   Theadora Rama Scales, PA-C  ibuprofen (ADVIL) 600 MG tablet Take 1 tablet (600 mg total) by mouth every 8 (eight) hours as needed for moderate pain. 08/11/22   Wallis Bamberg, PA-C  loperamide (IMODIUM) 2 MG capsule Take 1 capsule (2 mg total) by mouth 2 (two) times daily as needed for diarrhea or loose stools. 01/31/22   Wallis Bamberg, PA-C  norethindrone (MICRONOR) 0.35 MG tablet Take 1 tablet (0.35 mg total) by mouth daily. Start taking at 4 weeks postpartum, around 01/22/19 11/29/18   Calvert Cantor, CNM  Olopatadine HCl (PATADAY)  0.2 % SOLN Apply 1 drop to eye daily. 01/14/22   Theadora Rama Scales, PA-C  ondansetron (ZOFRAN-ODT) 8 MG disintegrating tablet Take 1 tablet (8 mg total) by mouth every 8 (eight) hours as needed for nausea or vomiting. 01/31/22   Wallis Bamberg, PA-C  pseudoephedrine (SUDAFED) 60 MG tablet Take 1 tablet (60 mg total) by mouth every 8 (eight) hours as needed for congestion. 08/11/22   Wallis Bamberg, PA-C      Allergies    Patient has no known allergies.    Review of Systems   Review of Systems  Cardiovascular:  Positive for chest pain.    Physical Exam Updated Vital Signs BP 104/62   Pulse 76   Temp 98.4 F (36.9 C) (Oral)   Resp 16   Ht 5\' 4"  (1.626 m)   Wt 120.7 kg   LMP 07/27/2022 (Exact Date)   SpO2 100%   BMI 45.66 kg/m  Physical Exam Vitals and nursing note reviewed.  Constitutional:      Appearance: She is well-developed.  HENT:     Head: Normocephalic and atraumatic.  Cardiovascular:     Rate and Rhythm: Normal rate and regular rhythm.  Pulmonary:     Effort: No respiratory distress.     Breath sounds: No stridor.  Chest:     Chest wall: No mass or  tenderness.  Abdominal:     General: There is no distension.     Palpations: Abdomen is soft.  Musculoskeletal:     Cervical back: Normal range of motion.  Neurological:     Mental Status: She is alert.     ED Results / Procedures / Treatments   Labs (all labs ordered are listed, but only abnormal results are displayed) Labs Reviewed  COMPREHENSIVE METABOLIC PANEL - Abnormal; Notable for the following components:      Result Value   Sodium 134 (*)    Glucose, Bld 102 (*)    Calcium 8.7 (*)    AST 45 (*)    All other components within normal limits  CBC WITH DIFFERENTIAL/PLATELET  LIPASE, BLOOD  TROPONIN I (HIGH SENSITIVITY)    EKG EKG Interpretation  Date/Time:  Tuesday Aug 23 2022 02:46:26 EDT Ventricular Rate:  79 PR Interval:  206 QRS Duration: 90 QT Interval:  347 QTC Calculation: 398 R  Axis:   38 Text Interpretation: Sinus rhythm Borderline prolonged PR interval Confirmed by Marily Memos [phone]) on 08/23/2022 3:45:52 AM  Radiology DG Chest 2 View  Result Date: 08/23/2022 CLINICAL DATA:  Chest pain EXAM: CHEST - 2 VIEW COMPARISON:  08/29/2016 FINDINGS: The heart size and mediastinal contours are within normal limits. Both lungs are clear. The visualized skeletal structures are unremarkable. IMPRESSION: No active cardiopulmonary disease. Electronically Signed   By: Charlett Nose M.D.   On: 08/23/2022 03:41    Procedures Procedures    Medications Ordered in ED Medications  alum & mag hydroxide-simeth (MAALOX/MYLANTA) 200-200-20 MG/5ML suspension 30 mL (30 mLs Oral Given 08/23/22 0341)    And  lidocaine (XYLOCAINE) 2 % viscous mouth solution 15 mL (15 mLs Oral Given 08/23/22 0338)  pantoprazole (PROTONIX) EC tablet 40 mg (40 mg Oral Given 08/23/22 4782)    ED Course/ Medical Decision Making/ A&P                             Medical Decision Making Amount and/or Complexity of Data Reviewed Labs: ordered. Radiology: ordered.  Risk OTC drugs. Prescription drug management.  Likely indigestion. Unclear why UC would suggest ibuprofen as that could make things worse. Less likely cardiac but is overweight with pressure and a week of symptoms, so will check one troponin, lipase, hepatobiliary labs and xr to ensure no emergent cause, otherwise will treat symptomatically.  Feeling better. Labs/imaging reassuring. Doubt ACS, PE, pneumonia or other emergent causes for symptoms. Will treat for GERD with GI fu if needed.   Final Clinical Impression(s) / ED Diagnoses Final diagnoses:  Nonspecific chest pain    Rx / DC Orders ED Discharge Orders          Ordered    pantoprazole (PROTONIX) 40 MG tablet  Daily        08/23/22 0428    famotidine (PEPCID) 20 MG tablet  2 times daily        08/23/22 0428    sucralfate (CARAFATE) 1 GM/10ML suspension  3 times daily with meals &  bedtime        08/23/22 0428              Cael Worth, Barbara Cower, MD 08/23/22 9562

## 2022-08-23 NOTE — ED Notes (Signed)
Patient transported to X-ray 

## 2022-08-23 NOTE — ED Triage Notes (Signed)
Pt complaining of chest pressure that started 1 week ago, was seen at urgent care for it and given famotidine. This has not helped the pain. Tonight it started around 11 pm and will not let her sleep.

## 2022-09-18 ENCOUNTER — Ambulatory Visit
Admission: EM | Admit: 2022-09-18 | Discharge: 2022-09-18 | Disposition: A | Discharge status: Home / Self Care | Payer: No Typology Code available for payment source | Attending: Urgent Care | Admitting: Urgent Care

## 2022-09-18 DIAGNOSIS — N898 Other specified noninflammatory disorders of vagina: Secondary | ICD-10-CM | POA: Diagnosis present

## 2022-09-18 DIAGNOSIS — R300 Dysuria: Secondary | ICD-10-CM | POA: Insufficient documentation

## 2022-09-18 DIAGNOSIS — R102 Pelvic and perineal pain: Secondary | ICD-10-CM | POA: Diagnosis present

## 2022-09-18 LAB — POCT URINALYSIS DIP (MANUAL ENTRY)
Bilirubin, UA: NEGATIVE
Blood, UA: NEGATIVE
Glucose, UA: NEGATIVE mg/dL
Ketones, POC UA: NEGATIVE mg/dL
Leukocytes, UA: NEGATIVE
Nitrite, UA: NEGATIVE
Protein Ur, POC: NEGATIVE mg/dL
Spec Grav, UA: 1.015 (ref 1.010–1.025)
Urobilinogen, UA: 0.2 E.U./dL
pH, UA: 5.5 (ref 5.0–8.0)

## 2022-09-18 LAB — POCT URINE PREGNANCY: Preg Test, Ur: NEGATIVE

## 2022-09-18 MED ORDER — NAPROXEN 375 MG PO TABS: 375.0000 mg | ORAL_TABLET | Freq: Two times a day (BID) | ORAL | 0 refills | Status: AC

## 2022-09-18 NOTE — ED Provider Notes (Signed)
[facility]  Note:  This document was prepared using Conservation officer, historic buildings and may include unintentional dictation errors.  MRN: [id number] DOB: [birth year]  Subjective:   Hannah Campbell is a 27 y.o. female presenting for 2-week history of persistent intermittent lower abdominal/pelvic cramps, dysuria.  Has had slight intermittent clumpy discharge.  Has a history of BV and yeast infections.  Denies fever, n/v, rashes, urinary frequency, hematuria.  Hydrates well daily, does drink fruit juice daily.  Low concern for STI but is not opposed to testing for this.  No gynecologic history including ovarian cyst, uterine fibroids, endometriosis that she knows of.  No current facility-administered medications for this encounter.  Current Outpatient Medications:    acetaminophen (TYLENOL) 325 MG tablet, Take 2 tablets (650 mg total) by mouth every 6 (six) hours as needed (for pain scale < 4)., Disp: 60 tablet, Rfl: 1   famotidine (PEPCID) 20 MG tablet, Take 1 tablet (20 mg total) by mouth 2 (two) times daily for 10 days., Disp: 20 tablet, Rfl: 0   fluticasone (FLONASE) 50 MCG/ACT nasal spray, Place 1 spray into both nostrils daily. Begin by using 2 sprays in each nare daily for 3 to 5 days, then decrease to 1 spray in each nare daily., Disp: 15.8 mL, Rfl: 2   ibuprofen (ADVIL) 600 MG tablet, Take 1 tablet (600 mg total) by mouth every 8 (eight) hours as needed for moderate pain., Disp: 30 tablet, Rfl: 0   loperamide (IMODIUM) 2 MG capsule, Take 1 capsule (2 mg total) by mouth 2 (two) times daily as needed for diarrhea or loose stools., Disp: 14 capsule, Rfl: 0   norethindrone (MICRONOR) 0.35 MG tablet, Take 1 tablet (0.35 mg total) by mouth daily. Start taking at 4 weeks postpartum, around 01/22/19, Disp: 1 Package, Rfl: 11   Olopatadine HCl (PATADAY) 0.2 % SOLN, Apply 1 drop to eye daily., Disp: 2.5 mL, Rfl: 1   ondansetron (ZOFRAN-ODT) 8 MG disintegrating tablet, Take 1  tablet (8 mg total) by mouth every 8 (eight) hours as needed for nausea or vomiting., Disp: 20 tablet, Rfl: 0   pantoprazole (PROTONIX) 40 MG tablet, Take 1 tablet (40 mg total) by mouth daily., Disp: 14 tablet, Rfl: 0   pseudoephedrine (SUDAFED) 60 MG tablet, Take 1 tablet (60 mg total) by mouth every 8 (eight) hours as needed for congestion., Disp: 30 tablet, Rfl: 0   sucralfate (CARAFATE) 1 GM/10ML suspension, Take 10 mLs (1 g total) by mouth 4 (four) times daily -  with meals and at bedtime., Disp: 420 mL, Rfl: 0   No Known Allergies  Past Medical History:  Diagnosis Date   Medical history non-contributory      Past Surgical History:  Procedure Laterality Date   NO PAST SURGERIES      Family History  Problem Relation Age of Onset   Diabetes Maternal Grandmother    Hypertension Maternal Grandmother     Social History   Tobacco Use   Smoking status: Never   Smokeless tobacco: Never  Vaping Use   Vaping Use: Never used  Substance Use Topics   Alcohol use: No   Drug use: No    ROS   Objective:   Vitals: BP 118/72 (BP Location: Left Arm)   Pulse 86   Temp 98.6 F (37 C) (Oral)   Resp 16   LMP  (Within Months) Comment: 1st week of May 2024  SpO2 96%   Physical Exam Constitutional:  General: She is not in acute distress.    Appearance: Normal appearance. She is well-developed. She is not ill-appearing, toxic-appearing or diaphoretic.  HENT:     Head: Normocephalic and atraumatic.     Nose: Nose normal.     Mouth/Throat:     Mouth: Mucous membranes are moist.  Eyes:     General: No scleral icterus.       Right eye: No discharge.        Left eye: No discharge.     Extraocular Movements: Extraocular movements intact.  Cardiovascular:     Rate and Rhythm: Normal rate.  Pulmonary:     Effort: Pulmonary effort is normal.  Skin:    General: Skin is warm and dry.  Neurological:     General: No focal deficit present.     Mental Status: She is alert and  oriented to person, place, and time.  Psychiatric:        Mood and Affect: Mood normal.        Behavior: Behavior normal.     Results for orders placed or performed during the hospital encounter of 09/18/22 (from the past 24 hour(s))  POCT urinalysis dipstick     Status: None   Collection Time: 09/18/22  2:01 PM  Result Value Ref Range   Color, UA yellow yellow   Clarity, UA clear clear   Glucose, UA negative negative mg/dL   Bilirubin, UA negative negative   Ketones, POC UA negative negative mg/dL   Spec Grav, UA 9.629 5.284 - 1.025   Blood, UA negative negative   pH, UA 5.5 5.0 - 8.0   Protein Ur, POC negative negative mg/dL   Urobilinogen, UA 0.2 0.2 or 1.0 E.U./dL   Nitrite, UA Negative Negative   Leukocytes, UA Negative Negative  POCT urine pregnancy     Status: None   Collection Time: 09/18/22  2:02 PM  Result Value Ref Range   Preg Test, Ur Negative Negative    Assessment and Plan :   PDMP not reviewed this encounter.  1. Dysuria   2. Vaginal discharge   3. Pelvic cramping    Testing pending, will treat as appropriate based off lab results.  Continue to hydrate very well, avoid urinary irritants.  Use naproxen for pelvic cramping.  Low suspicion for an acute gynecologic emergency such as ectopic pregnancy, ovarian torsion.  Counseled patient on potential for adverse effects with medications prescribed/recommended today, ER and return-to-clinic precautions discussed, patient verbalized understanding.    Wallis Bamberg, [place] 09/18/22 1450

## 2022-09-18 NOTE — Discharge Instructions (Signed)
Make sure you hydrate very well with plain water and a quantity of 80 ounces of water a day.  Please limit drinks that are considered urinary irritants such as soda, sweet tea, coffee, energy drinks, alcohol.  These can worsen your urinary and genital symptoms but also be the source of them.  I will let you know about your vaginal swab and urine culture results through MyChart to see if we need to prescribe or change your antibiotics based off of those results.

## 2022-09-18 NOTE — ED Triage Notes (Addendum)
Pt reports low abdominal cramps and burning when urinating x 2 weeks.

## 2022-09-20 LAB — URINE CULTURE

## 2022-09-21 ENCOUNTER — Telehealth (HOSPITAL_COMMUNITY): Payer: Self-pay | Admitting: Emergency Medicine

## 2022-09-21 LAB — CERVICOVAGINAL ANCILLARY ONLY
Bacterial Vaginitis (gardnerella): NEGATIVE
Candida Glabrata: NEGATIVE
Candida Vaginitis: NEGATIVE
Chlamydia: NEGATIVE
Comment: NEGATIVE
Comment: NEGATIVE
Comment: NEGATIVE
Comment: NEGATIVE
Comment: NEGATIVE
Comment: NORMAL
Neisseria Gonorrhea: NEGATIVE
Trichomonas: NEGATIVE

## 2022-09-21 LAB — URINE CULTURE: Culture: 10000 — AB

## 2022-09-21 MED ORDER — NITROFURANTOIN MONOHYD MACRO 100 MG PO CAPS: 100.0000 mg | ORAL_CAPSULE | Freq: Two times a day (BID) | ORAL | 0 refills | Status: DC

## 2023-06-24 ENCOUNTER — Ambulatory Visit
Admission: EM | Admit: 2023-06-24 | Discharge: 2023-06-24 | Disposition: A | Discharge status: Home / Self Care | Attending: Family Medicine | Admitting: Family Medicine

## 2023-06-24 DIAGNOSIS — R300 Dysuria: Secondary | ICD-10-CM | POA: Insufficient documentation

## 2023-06-24 DIAGNOSIS — N3000 Acute cystitis without hematuria: Secondary | ICD-10-CM | POA: Diagnosis not present

## 2023-06-24 DIAGNOSIS — N760 Acute vaginitis: Secondary | ICD-10-CM | POA: Diagnosis not present

## 2023-06-24 LAB — POCT URINALYSIS DIP (MANUAL ENTRY)
Bilirubin, UA: NEGATIVE
Blood, UA: NEGATIVE
Glucose, UA: NEGATIVE mg/dL
Ketones, POC UA: NEGATIVE mg/dL
Nitrite, UA: NEGATIVE
Protein Ur, POC: NEGATIVE mg/dL
Spec Grav, UA: 1.015
Urobilinogen, UA: 0.2 U/dL
pH, UA: 6

## 2023-06-24 LAB — POCT URINE PREGNANCY: Preg Test, Ur: NEGATIVE

## 2023-06-24 MED ORDER — CEPHALEXIN 500 MG PO CAPS: 500.0000 mg | ORAL_CAPSULE | Freq: Two times a day (BID) | ORAL | 0 refills | Status: DC

## 2023-06-24 MED ORDER — FLUCONAZOLE 150 MG PO TABS: 150.0000 mg | ORAL_TABLET | ORAL | 0 refills | Status: DC

## 2023-06-24 NOTE — Discharge Instructions (Addendum)
 Please start fluconazole to address a yeast infection. Start cephalexin for UTI. Make sure you hydrate very well with plain water and a quantity of 80 ounces of water a day.  Please limit drinks that are considered urinary irritants such as soda, sweet tea, coffee, energy drinks, alcohol.  These can worsen your urinary and genital symptoms but also be the source of them.  I will let you know about your urine culture and vaginal swab results through MyChart to see if we need to prescribe or change your antibiotics based off of those results.

## 2023-06-24 NOTE — ED Triage Notes (Addendum)
 Pt c/o dysuria x 2 weeks and abd cramping, vaginal irritation x 5-6 days-NAD-steady gait

## 2023-06-24 NOTE — ED Provider Notes (Signed)
 [facility]  Note:  This document was prepared using Conservation officer, historic buildings and may include unintentional dictation errors.  MRN: [id number] DOB: 02-Aug-1995  Subjective:   Hannah Campbell is a 28 y.o. female presenting for 2-week history of persistent dysuria, urinary urgency, intermittent pelvic cramping, vaginal irritation and burning.  No active vaginal discharge.  No fever, nausea, vomiting.  No vaginal bleeding.  No hematuria.  Patient would like to have vaginal cytology, UTI check.  Hydrates with 80 to 100 ounces water daily.  Drinks apple juice regularly as well.  No current facility-administered medications for this encounter.  Current Outpatient Medications:    acetaminophen (TYLENOL) 325 MG tablet, Take 2 tablets (650 mg total) by mouth every 6 (six) hours as needed (for pain scale < 4)., Disp: 60 tablet, Rfl: 1   famotidine (PEPCID) 20 MG tablet, Take 1 tablet (20 mg total) by mouth 2 (two) times daily for 10 days., Disp: 20 tablet, Rfl: 0   fluticasone (FLONASE) 50 MCG/ACT nasal spray, Place 1 spray into both nostrils daily. Begin by using 2 sprays in each nare daily for 3 to 5 days, then decrease to 1 spray in each nare daily., Disp: 15.8 mL, Rfl: 2   ibuprofen (ADVIL) 600 MG tablet, Take 1 tablet (600 mg total) by mouth every 8 (eight) hours as needed for moderate pain., Disp: 30 tablet, Rfl: 0   loperamide (IMODIUM) 2 MG capsule, Take 1 capsule (2 mg total) by mouth 2 (two) times daily as needed for diarrhea or loose stools., Disp: 14 capsule, Rfl: 0   naproxen (NAPROSYN) 375 MG tablet, Take 1 tablet (375 mg total) by mouth 2 (two) times daily with a meal., Disp: 30 tablet, Rfl: 0   nitrofurantoin, macrocrystal-monohydrate, (MACROBID) 100 MG capsule, Take 1 capsule (100 mg total) by mouth 2 (two) times daily., Disp: 10 capsule, Rfl: 0   norethindrone (MICRONOR) 0.35 MG tablet, Take 1 tablet (0.35 mg total) by mouth daily. Start taking at 4 weeks  postpartum, around 01/22/19, Disp: 1 Package, Rfl: 11   Olopatadine HCl (PATADAY) 0.2 % SOLN, Apply 1 drop to eye daily., Disp: 2.5 mL, Rfl: 1   ondansetron (ZOFRAN-ODT) 8 MG disintegrating tablet, Take 1 tablet (8 mg total) by mouth every 8 (eight) hours as needed for nausea or vomiting., Disp: 20 tablet, Rfl: 0   pantoprazole (PROTONIX) 40 MG tablet, Take 1 tablet (40 mg total) by mouth daily., Disp: 14 tablet, Rfl: 0   pseudoephedrine (SUDAFED) 60 MG tablet, Take 1 tablet (60 mg total) by mouth every 8 (eight) hours as needed for congestion., Disp: 30 tablet, Rfl: 0   sucralfate (CARAFATE) 1 GM/10ML suspension, Take 10 mLs (1 g total) by mouth 4 (four) times daily -  with meals and at bedtime., Disp: 420 mL, Rfl: 0   No Known Allergies  Past Medical History:  Diagnosis Date   Medical history non-contributory      Past Surgical History:  Procedure Laterality Date   NO PAST SURGERIES      Family History  Problem Relation Age of Onset   Diabetes Maternal Grandmother    Hypertension Maternal Grandmother     Social History   Tobacco Use   Smoking status: Never   Smokeless tobacco: Never  Vaping Use   Vaping status: Never Used  Substance Use Topics   Alcohol use: Yes    Comment: occ   Drug use: No    ROS   Objective:  Vitals: BP 121/80 (BP Location: Right Arm)   Pulse 82   Temp 98.6 F (37 C) (Oral)   Resp 20   LMP 06/03/2023   SpO2 98%   Physical Exam Constitutional:      General: She is not in acute distress.    Appearance: Normal appearance. She is well-developed. She is not ill-appearing, toxic-appearing or diaphoretic.  HENT:     Head: Normocephalic and atraumatic.     Nose: Nose normal.     Mouth/Throat:     Mouth: Mucous membranes are moist.     Pharynx: Oropharynx is clear.  Eyes:     General: No scleral icterus.       Right eye: No discharge.        Left eye: No discharge.     Extraocular Movements: Extraocular movements intact.      Conjunctiva/sclera: Conjunctivae normal.  Cardiovascular:     Rate and Rhythm: Normal rate.  Pulmonary:     Effort: Pulmonary effort is normal.  Abdominal:     General: Bowel sounds are normal. There is no distension.     Palpations: Abdomen is soft. There is no mass.     Tenderness: There is no abdominal tenderness. There is no right CVA tenderness, left CVA tenderness, guarding or rebound.  Skin:    General: Skin is warm and dry.  Neurological:     General: No focal deficit present.     Mental Status: She is alert and oriented to person, place, and time.  Psychiatric:        Mood and Affect: Mood normal.        Behavior: Behavior normal.        Thought Content: Thought content normal.        Judgment: Judgment normal.     Results for orders placed or performed during the hospital encounter of 06/24/23 (from the past 24 hours)  POCT urine pregnancy     Status: Normal   Collection Time: 06/24/23  1:10 PM  Result Value Ref Range   Preg Test, Ur Negative   POCT urinalysis dipstick     Status: Abnormal   Collection Time: 06/24/23  1:11 PM  Result Value Ref Range   Color, UA yellow    Clarity, UA cloudy (A)    Glucose, UA negative mg/dL   Bilirubin, UA negative    Ketones, POC UA negative mg/dL   Spec Grav, UA 1.478    Blood, UA negative    pH, UA 6.0    Protein Ur, POC negative mg/dL   Urobilinogen, UA 0.2 E.U./dL   Nitrite, UA Negative    Leukocytes, UA Small (1+) (A)     Assessment and Plan :   PDMP not reviewed this encounter.  1. Acute vaginitis   2. Dysuria   3. Acute cystitis without hematuria    Will cover for acute cystitis and vaginitis with cephalexin and fluconazole respectively.  Urine culture and vaginal cytology pending.  Counseled patient on potential for adverse effects with medications prescribed/recommended today, ER and return-to-clinic precautions discussed, patient verbalized understanding.    Wallis Bamberg, PA-C 06/24/23 1318

## 2023-06-25 LAB — URINE CULTURE: Culture: <10000 — AB

## 2023-06-26 LAB — CERVICOVAGINAL ANCILLARY ONLY
Bacterial Vaginitis (gardnerella): NEGATIVE
Candida Glabrata: NEGATIVE
Candida Vaginitis: POSITIVE — AB
Chlamydia: NEGATIVE
Comment: NEGATIVE
Comment: NEGATIVE
Comment: NEGATIVE
Comment: NEGATIVE
Comment: NEGATIVE
Comment: NORMAL
Neisseria Gonorrhea: NEGATIVE
Trichomonas: NEGATIVE

## 2023-10-03 ENCOUNTER — Other Ambulatory Visit: Payer: Self-pay

## 2023-10-03 ENCOUNTER — Ambulatory Visit
Admission: RE | Admit: 2023-10-03 | Discharge: 2023-10-03 | Disposition: A | Discharge status: Home / Self Care | Source: Ambulatory Visit | Attending: Family Medicine | Admitting: Family Medicine

## 2023-10-03 VITALS — BP 133/86 | HR 75 | Temp 98.2°F | Resp 16

## 2023-10-03 DIAGNOSIS — Z113 Encounter for screening for infections with a predominantly sexual mode of transmission: Secondary | ICD-10-CM | POA: Insufficient documentation

## 2023-10-03 DIAGNOSIS — N3000 Acute cystitis without hematuria: Secondary | ICD-10-CM | POA: Diagnosis present

## 2023-10-03 LAB — POCT URINALYSIS DIP (MANUAL ENTRY)
Bilirubin, UA: NEGATIVE
Blood, UA: NEGATIVE
Glucose, UA: NEGATIVE mg/dL
Ketones, POC UA: NEGATIVE mg/dL
Nitrite, UA: NEGATIVE
Protein Ur, POC: NEGATIVE mg/dL
Spec Grav, UA: 1.025 (ref 1.010–1.025)
Urobilinogen, UA: 0.2 U/dL
pH, UA: 6 (ref 5.0–8.0)

## 2023-10-03 LAB — POCT URINE PREGNANCY: Preg Test, Ur: NEGATIVE

## 2023-10-03 MED ORDER — NITROFURANTOIN MONOHYD MACRO 100 MG PO CAPS
100.0000 mg | ORAL_CAPSULE | Freq: Two times a day (BID) | ORAL | 0 refills | Status: AC
Start: 2023-10-03 — End: ?

## 2023-10-03 NOTE — Discharge Instructions (Signed)
 The clinic will contact you with results of urine culture and vaginal swab done today if positive.  Start Macrobid  twice daily for 5 days.  Lots of rest and fluids.  Please follow-up with your PCP or gynecologist if your symptoms do not improve.  Please go to the ER for any worsening symptoms.  Hope you feel better soon!

## 2023-10-03 NOTE — ED Triage Notes (Signed)
 Pt c/o dysuria, lower back pain RLQ, LLQ abdominal painx2wks.

## 2023-10-03 NOTE — ED Provider Notes (Signed)
 [facility]    CSN: [id number] Arrival date & time: 10/03/23  1552      History   Chief Complaint Chief Complaint  Patient presents with   Dysuria   Abdominal Pain   Back Pain    HPI Hannah Campbell is a 28 y.o. female presents for dysuria.  Patient reports 2 weeks of intermittent dysuria without urgency, frequency, hematuria.  She does endorse some suprapubic pressure but denies abdominal pain, nausea/vomiting, flank pain, fevers.  No vaginal discharge, rashes or STD concern but she would like screening.  States she was treated by the health department at the beginning of June for UTI and states her symptoms improved but then returned after she stopped treatment.  She does not recall the antibiotic she was on.  Does report a history of recurrent UTIs.  Does endorse back pain but states she has a history of scoliosis and she always has back pain.  Denies any change in her back pain symptoms.  No OTC treatments have been used since onset.  No other concerns at this time.   Dysuria Abdominal Pain Associated symptoms: dysuria   Back Pain Associated symptoms: dysuria     Past Medical History:  Diagnosis Date   Medical history non-contributory     Patient Active Problem List   Diagnosis Date Noted   Post term pregnancy over 40 weeks 11/26/2018   Dysmenorrhea 10/02/2017   Uses contraception 10/02/2017   Contraception management 10/06/2014    Past Surgical History:  Procedure Laterality Date   NO PAST SURGERIES      OB History     Gravida  1   Para  1   Term  1   Preterm      AB      Living  1      SAB      IAB      Ectopic      Multiple  0   Live Births  1            Home Medications    Prior to Admission medications   Medication Sig Start Date End Date Taking? Authorizing Provider  nitrofurantoin , macrocrystal-monohydrate, (MACROBID ) 100 MG capsule Take 1 capsule (100 mg total) by mouth 2 (two) times daily. 10/03/23  Yes Marylon Verno, Jodi R,  NP  acetaminophen  (TYLENOL ) 325 MG tablet Take 2 tablets (650 mg total) by mouth every 6 (six) hours as needed (for pain scale < 4). 11/28/18   Fair, Mitzie SAILOR, MD  famotidine  (PEPCID ) 20 MG tablet Take 1 tablet (20 mg total) by mouth 2 (two) times daily for 10 days. 08/23/22 09/02/22  Mesner, Selinda, MD  fluticasone  (FLONASE ) 50 MCG/ACT nasal spray Place 1 spray into both nostrils daily. Begin by using 2 sprays in each nare daily for 3 to 5 days, then decrease to 1 spray in each nare daily. 01/14/22   Joesph Shaver Scales, PA-C  naproxen  (NAPROSYN ) 375 MG tablet Take 1 tablet (375 mg total) by mouth 2 (two) times daily with a meal. 09/18/22   Christopher Savannah, PA-C  norethindrone  (MICRONOR ) 0.35 MG tablet Take 1 tablet (0.35 mg total) by mouth daily. Start taking at 4 weeks postpartum, around 01/22/19 11/29/18   Gene Lucie BROCKS, CNM  ondansetron  (ZOFRAN -ODT) 8 MG disintegrating tablet Take 1 tablet (8 mg total) by mouth every 8 (eight) hours as needed for nausea or vomiting. 01/31/22   Christopher Savannah, PA-C  pantoprazole  (PROTONIX ) 40 MG tablet Take 1 tablet (40 mg  total) by mouth daily. 08/23/22   Mesner, Selinda, MD  pseudoephedrine  (SUDAFED) 60 MG tablet Take 1 tablet (60 mg total) by mouth every 8 (eight) hours as needed for congestion. 08/11/22   Christopher Savannah, PA-C  sucralfate  (CARAFATE ) 1 GM/10ML suspension Take 10 mLs (1 g total) by mouth 4 (four) times daily -  with meals and at bedtime. 08/23/22   Mesner, Selinda, MD    Family History Family History  Problem Relation Age of Onset   Diabetes Maternal Grandmother    Hypertension Maternal Grandmother     Social History Social History   Tobacco Use   Smoking status: Never   Smokeless tobacco: Never  Vaping Use   Vaping status: Never Used  Substance Use Topics   Alcohol use: Yes    Comment: occ   Drug use: No     Allergies   Patient has no known allergies.   Review of Systems Review of Systems  Genitourinary:  Positive for dysuria.      Physical Exam Triage Vital Signs ED Triage Vitals  Encounter Vitals Group     BP 10/03/23 1602 133/86     Girls Systolic BP Percentile --      Girls Diastolic BP Percentile --      Boys Systolic BP Percentile --      Boys Diastolic BP Percentile --      Pulse Rate 10/03/23 1602 75     Resp 10/03/23 1602 16     Temp 10/03/23 1602 98.2 F (36.8 C)     Temp Source 10/03/23 1602 Oral     SpO2 10/03/23 1602 98 %     Weight --      Height --      Head Circumference --      Peak Flow --      Pain Score 10/03/23 1559 8     Pain Loc --      Pain Education --      Exclude from Growth Chart --    No data found.  Updated Vital Signs BP 133/86   Pulse 75   Temp 98.2 F (36.8 C) (Oral)   Resp 16   LMP 09/03/2023   SpO2 98%   Visual Acuity Right Eye Distance:   Left Eye Distance:   Bilateral Distance:    Right Eye Near:   Left Eye Near:    Bilateral Near:     Physical Exam Vitals and nursing note reviewed.  Constitutional:      General: She is not in acute distress.    Appearance: Normal appearance. She is not ill-appearing.  HENT:     Head: Normocephalic and atraumatic.  Eyes:     Pupils: Pupils are equal, round, and reactive to light.  Cardiovascular:     Rate and Rhythm: Normal rate.  Pulmonary:     Effort: Pulmonary effort is normal.  Abdominal:     Tenderness: There is no abdominal tenderness. There is no right CVA tenderness or left CVA tenderness.  Skin:    General: Skin is warm and dry.  Neurological:     General: No focal deficit present.     Mental Status: She is alert and oriented to person, place, and time.  Psychiatric:        Mood and Affect: Mood normal.        Behavior: Behavior normal.      UC Treatments / Results  Labs (all labs ordered are listed, but only abnormal results are displayed)  Labs Reviewed  POCT URINALYSIS DIP (MANUAL ENTRY) - Abnormal; Notable for the following components:      Result Value   Leukocytes, UA Trace  (*)    All other components within normal limits  URINE CULTURE  POCT URINE PREGNANCY  CERVICOVAGINAL ANCILLARY ONLY    EKG   Radiology No results found.  Procedures Procedures (including critical care time)  Medications Ordered in UC Medications - No data to display  Initial Impression / Assessment and Plan / UC Course  I have reviewed the triage vital signs and the nursing notes.  Pertinent labs & imaging results that were available during my care of the patient were reviewed by me and considered in my medical decision making (see chart for details).     I reviewed exam and symptoms with patient.  No red flags.  Urine with trace leuks otherwise unremarkable, will send urine culture.  Patient would like to start treatment while awaiting culture results, start Macrobid .  Vaginal swab/STD testing is ordered and will contact for any positive results.  Advise rest fluids and PCP or GYN follow-up if symptoms do not improve.  ER precautions reviewed. Final Clinical Impressions(s) / UC Diagnoses   Final diagnoses:  Acute cystitis without hematuria  Screening examination for STD (sexually transmitted disease)     Discharge Instructions      The clinic will contact you with results of urine culture and vaginal swab done today if positive.  Start Macrobid  twice daily for 5 days.  Lots of rest and fluids.  Please follow-up with your PCP or gynecologist if your symptoms do not improve.  Please go to the ER for any worsening symptoms.  Hope you feel better soon!    ED Prescriptions     Medication Sig Dispense Auth. Provider   nitrofurantoin , macrocrystal-monohydrate, (MACROBID ) 100 MG capsule Take 1 capsule (100 mg total) by mouth 2 (two) times daily. 10 capsule Melek Pownall, Jodi R, NP      PDMP not reviewed this encounter.   Loreda Myla SAUNDERS, NP 10/03/23 [phone]

## 2023-10-04 LAB — CERVICOVAGINAL ANCILLARY ONLY
Bacterial Vaginitis (gardnerella): NEGATIVE
Candida Glabrata: NEGATIVE
Candida Vaginitis: NEGATIVE
Chlamydia: NEGATIVE
Comment: NEGATIVE
Comment: NEGATIVE
Comment: NEGATIVE
Comment: NEGATIVE
Comment: NEGATIVE
Comment: NORMAL
Neisseria Gonorrhea: NEGATIVE
Trichomonas: NEGATIVE

## 2023-10-05 LAB — URINE CULTURE: Culture: 80000 — AB

## 2023-10-05 IMAGING — US US PELVIS COMPLETE WITH TRANSVAGINAL
1 series · 13 of 25 positions shown · non-contrast
Comparison: None

CLINICAL DATA: Menorrhagia with regular cycle, unknown LMP



[Series 1: us pelvic complete with transvaginal · 13 of 85 slices shown]
[im 1/85]
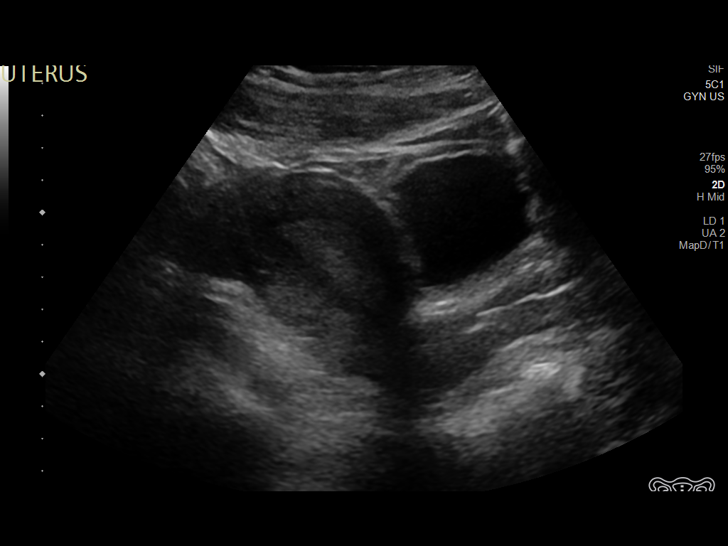
[im 8/85]
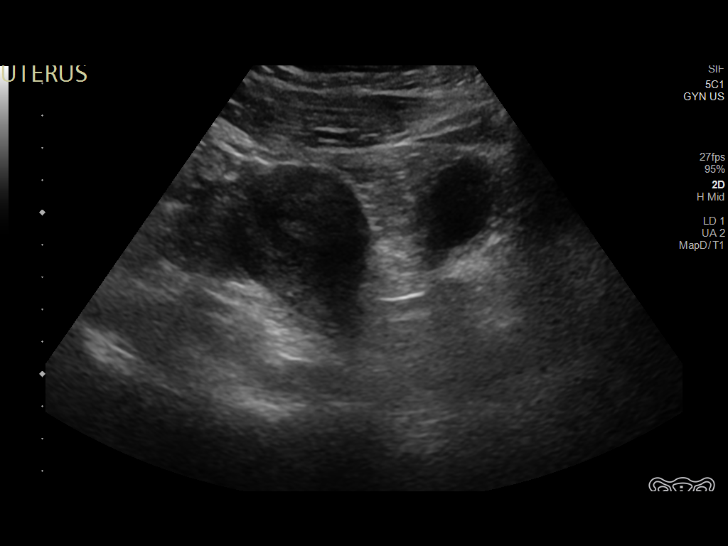
[im 15/85]
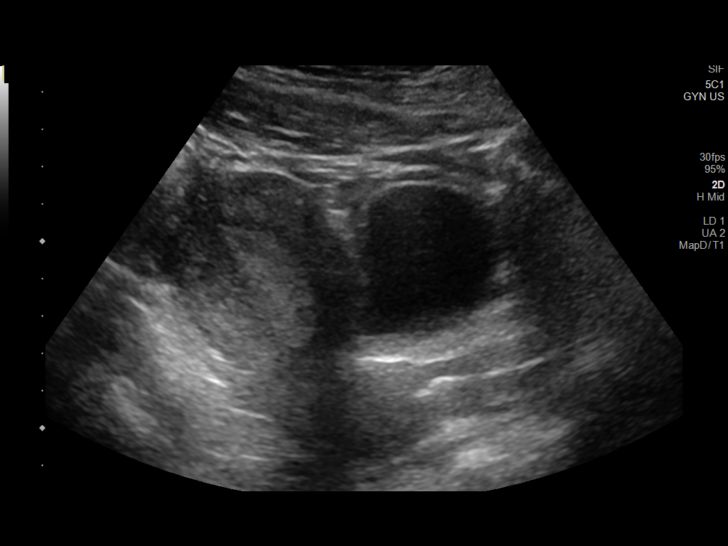
[im 22/85]
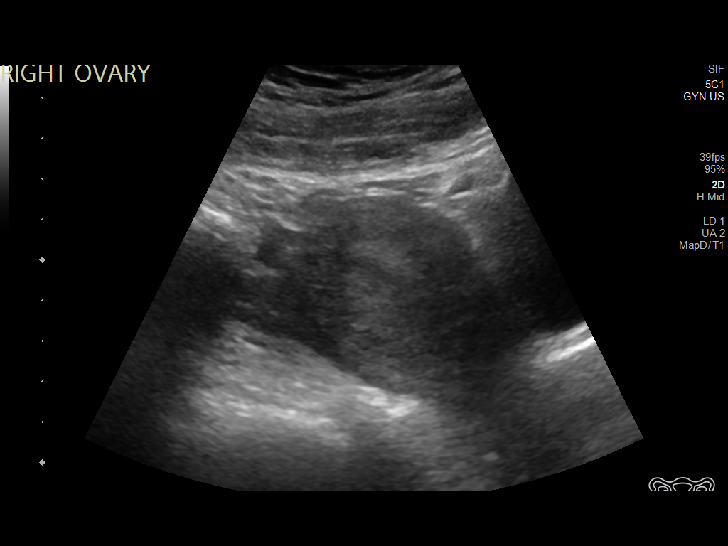
[im 29/85]
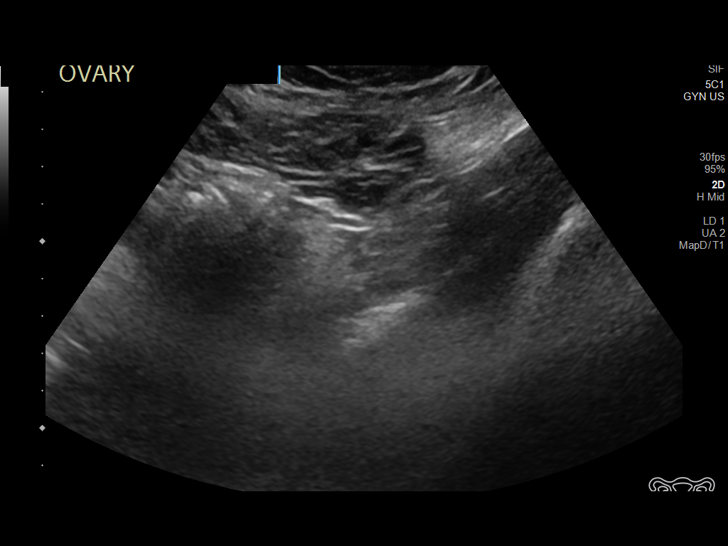
[im 36/85]
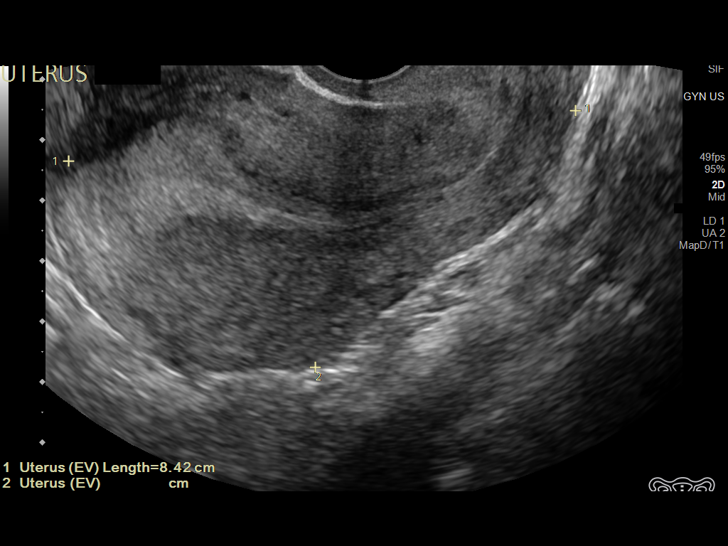
[im 43/85]
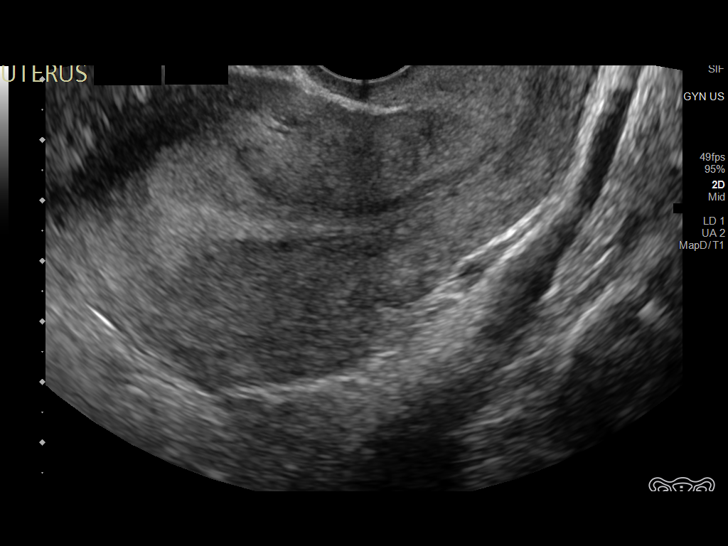
[im 50/85]
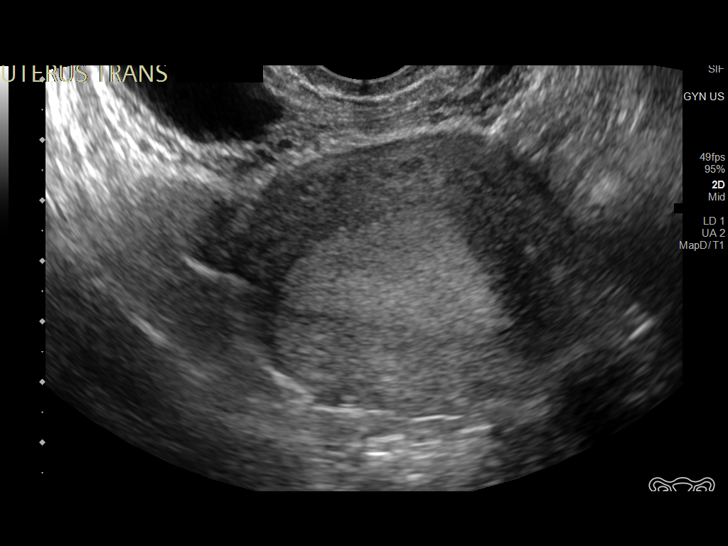
[im 57/85]
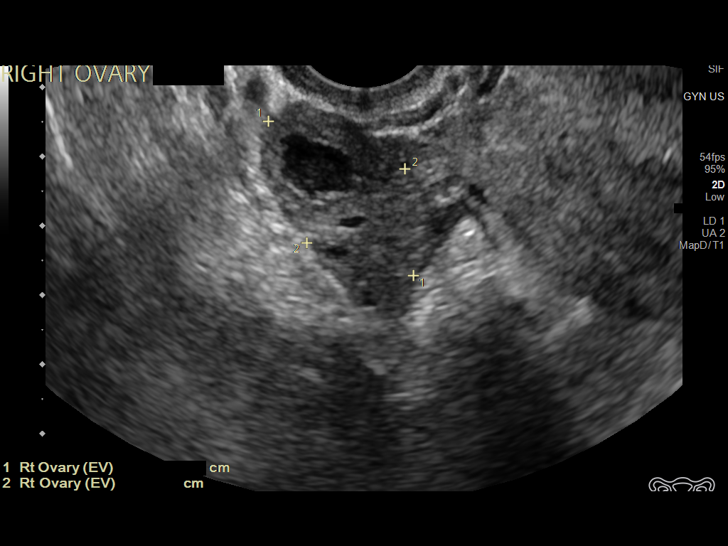
[im 64/85]
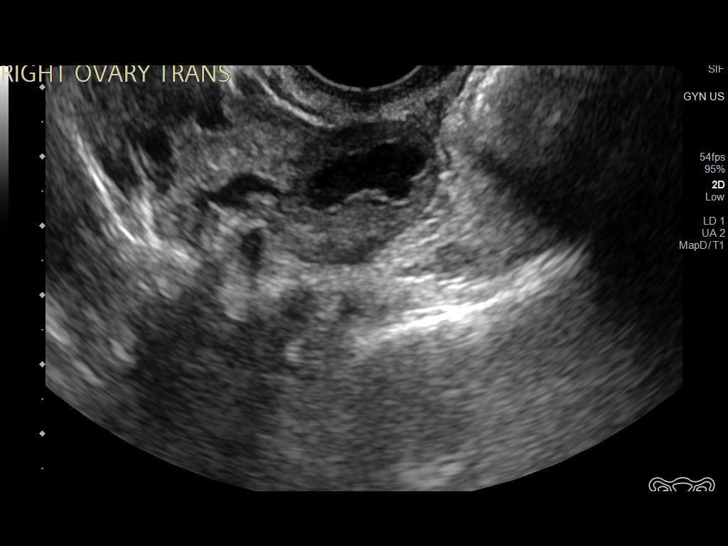
[im 71/85]
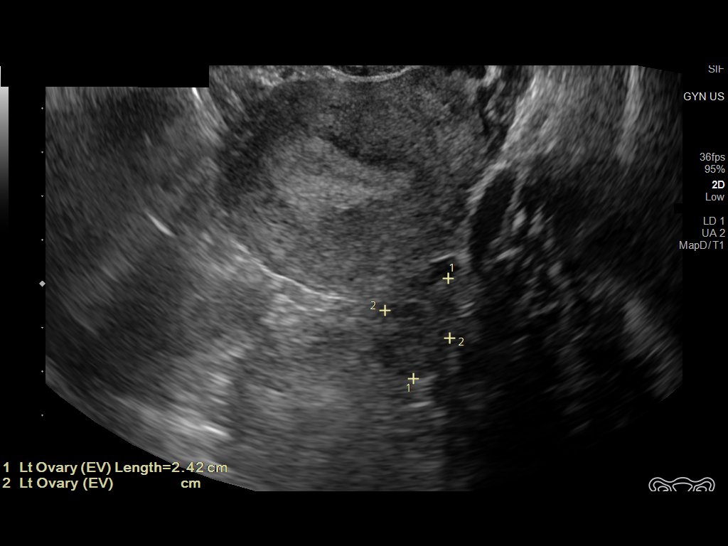
[im 78/85]
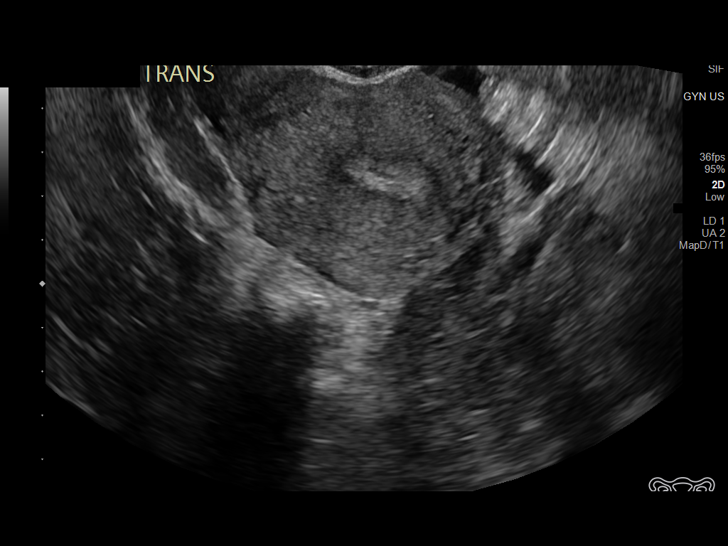
[im 85/85]
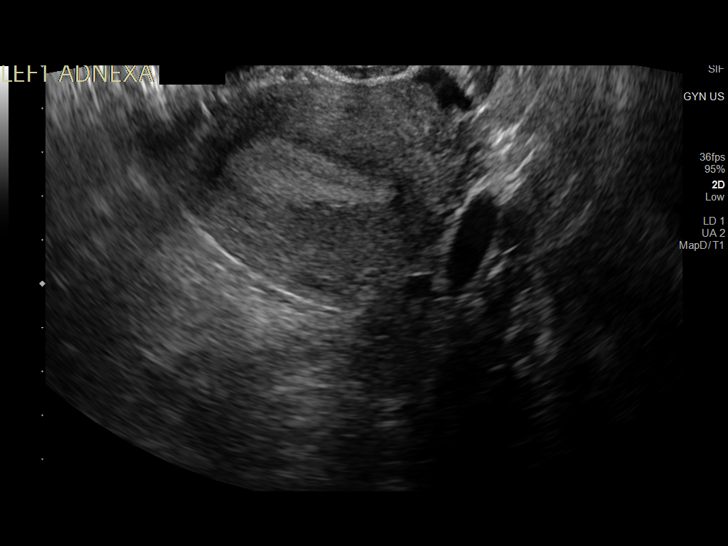

[13 of 25 positions shown; findings below may reference images not displayed]

FINDINGS: Uterus

Measurements: 8.8 x 4.4 x 5.4 cm = volume: 110 mL. Anteverted.
Normal morphology without mass

Endometrium

Thickness: 18 mm.  No endometrial fluid or mass

Right ovary

Measurements: 3.1 x 1.8 x 2.3 cm = volume: 6.6 mL. Normal morphology
without mass

Left ovary

Measurements: 2.4 x 1.6 x 1.4 cm = volume: 2.9 mL. Normal morphology
without mass

Other findings

No free pelvic fluid.  No adnexal masses.
IMPRESSION: Endometrial complex measures 18 mm thick; if bleeding remains
unresponsive to hormonal or medical therapy, focal lesion work-up
with sonohysterogram should be considered. Endometrial biopsy should
also be considered in pre-menopausal patients at high risk for
endometrial carcinoma. (Ref: Radiological Reasoning: Algorithmic
Workup of Abnormal Vaginal Bleeding with Endovaginal Sonography and
Sonohysterography. AJR [id number]; 191:[id number])

## 2023-10-06 ENCOUNTER — Ambulatory Visit (HOSPITAL_COMMUNITY): Payer: Self-pay

## 2023-10-06 MED ORDER — AMOXICILLIN-POT CLAVULANATE 875-125 MG PO TABS: 1.0000 | ORAL_TABLET | Freq: Two times a day (BID) | ORAL | 0 refills | Status: AC

## 2023-10-20 ENCOUNTER — Other Ambulatory Visit: Payer: Self-pay

## 2023-10-20 ENCOUNTER — Encounter (HOSPITAL_BASED_OUTPATIENT_CLINIC_OR_DEPARTMENT_OTHER): Payer: Self-pay | Admitting: Emergency Medicine

## 2023-10-20 ENCOUNTER — Emergency Department (HOSPITAL_BASED_OUTPATIENT_CLINIC_OR_DEPARTMENT_OTHER): Admission: EM | Admit: 2023-10-20 | Discharge: 2023-10-20 | Disposition: A | Discharge status: Home / Self Care

## 2023-10-20 ENCOUNTER — Emergency Department (HOSPITAL_BASED_OUTPATIENT_CLINIC_OR_DEPARTMENT_OTHER)

## 2023-10-20 DIAGNOSIS — N941 Unspecified dyspareunia: Secondary | ICD-10-CM | POA: Diagnosis not present

## 2023-10-20 DIAGNOSIS — R102 Pelvic and perineal pain: Secondary | ICD-10-CM | POA: Insufficient documentation

## 2023-10-20 LAB — URINALYSIS, MICROSCOPIC (REFLEX)

## 2023-10-20 LAB — URINALYSIS, ROUTINE W REFLEX MICROSCOPIC
Bilirubin Urine: NEGATIVE
Glucose, UA: NEGATIVE mg/dL
Hgb urine dipstick: NEGATIVE
Ketones, ur: NEGATIVE mg/dL
Leukocytes,Ua: NEGATIVE
Nitrite: POSITIVE — AB
Protein, ur: NEGATIVE mg/dL
Specific Gravity, Urine: 1.02 (ref 1.005–1.030)
pH: 5.5 (ref 5.0–8.0)

## 2023-10-20 LAB — PREGNANCY, URINE: Preg Test, Ur: NEGATIVE

## 2023-10-20 NOTE — Discharge Instructions (Addendum)
 As discussed your urine appears normal and has been sent for culture. Your pelvic ultrasound was normal. You have  a small cyst on your left ovary called a corpus luteal cyst. This is a very common and normal finding in people who are still having periods. What is a corpus luteal cyst?  After ovulation (when an egg is released from the ovary), a small structure called the corpus luteum forms. Sometimes, this structure fills with a little fluid and becomes a cyst.  These cysts are usually small (1-3 cm), have a thick wall, and are part of the normal menstrual cycle. They are not cancerous and almost always go away on their own within a few weeks to months.[2-3][5] Is this dangerous?  Corpus luteal cysts are considered harmless in most cases. They do not increase your risk of ovarian cancer.[1][3]  Most people do not need any treatment or follow-up for these cysts unless they are very large or causing significant symptoms.[1][3] Can this cause pain?  Sometimes, a corpus luteal cyst can cause mild pelvic pain or discomfort, usually on the same side as the cyst. This is because the ovary can swell a little or release a small amount of fluid during ovulation.[6]  This type of pain is called Mittelschmerz ([place] for middle pain), which is a normal, brief pain that some people feel around the middle of their menstrual cycle when they ovulate.[4]  On ultrasound, doctors may see a small amount of fluid in the pelvis, which is also normal and can be a sign of recent ovulation.[4] What should you watch for?  Most of the time, no action is needed. If you have mild pain that goes away on its own, this is expected.  If you have severe pain, pain that does not go away, fever, or feel very unwell, you should seek medical attention. Rarely, cysts can rupture or twist, which can cause more serious symptoms.[6] What happens next?  In most cases, no follow-up is needed for a small corpus luteal cyst if you are not  having significant symptoms.[1][3]  If you have ongoing pain or other symptoms, your doctor may recommend a repeat ultrasound in a few months to make sure the cyst has gone away. Summary  A corpus luteal cyst is a normal, harmless finding after ovulation.  It may cause mild, brief pain (Mittelschmerz), but usually goes away on its own.  No treatment is needed unless you have severe or ongoing symptoms. If you have any questions or new symptoms, please contact your healthcare provider. Get help right away for any new or worsening symptoms.

## 2023-10-20 NOTE — ED Triage Notes (Signed)
 Pt POV steady gait- pt c/o recurrent UTI with antibiotics x2, still having ongoing sx. C/o irritation, burning, sharp L pelvic pain since June.   Denies fever, discharge.

## 2023-10-20 NOTE — ED Notes (Addendum)
Charting on wrong pt

## 2023-10-20 NOTE — ED Provider Notes (Signed)
 [place] [facility] Provider Note   CSN: [id number] Arrival date & time: 10/20/23  1124     Patient presents with: Pelvic Pain   Hannah Campbell is a 28 y.o. female who presents emergency department with chief complaint of left-sided pelvic pain.  Patient states that she has had ongoing symptoms of vaginal pain and dyspareunia along with a burning sensation.  Patient also states that she was recently told she had a urinary tract infection and was treated with Bactrim  symptoms came back and so she was treated with amoxicillin  at the beginning of this month.  She had trouble tolerating the medication because it gave her diarrhea but completed the course only missing 1 dose and then taking it later than normal.  She was afraid maybe she messed up her urine treatment because of this.  She states that last night she had sharp fleeting pain on the left side of her abdomen but it made her feel like she had a limp for a while.  No flank pain.  No fevers or chills.  She has been having multiple vaginal complaints on review of EMR for a long period of time.  She has a new PCP and has been referred to urogynecology.  The patient denies fevers chills nausea or vomiting and currently rates her discomfort as 1-2 out of 10.    Pelvic Pain       Prior to Admission medications   Medication Sig Start Date End Date Taking? Authorizing Provider  acetaminophen  (TYLENOL ) 325 MG tablet Take 2 tablets (650 mg total) by mouth every 6 (six) hours as needed (for pain scale < 4). 11/28/18   Fair, Mitzie SAILOR, MD  famotidine  (PEPCID ) 20 MG tablet Take 1 tablet (20 mg total) by mouth 2 (two) times daily for 10 days. 08/23/22 09/02/22  Mesner, Selinda, MD  fluticasone  (FLONASE ) 50 MCG/ACT nasal spray Place 1 spray into both nostrils daily. Begin by using 2 sprays in each nare daily for 3 to 5 days, then decrease to 1 spray in each nare daily. 01/14/22   Joesph Shaver Scales, PA-C  naproxen   (NAPROSYN ) 375 MG tablet Take 1 tablet (375 mg total) by mouth 2 (two) times daily with a meal. 09/18/22   Christopher Savannah, PA-C  nitrofurantoin , macrocrystal-monohydrate, (MACROBID ) 100 MG capsule Take 1 capsule (100 mg total) by mouth 2 (two) times daily. 10/03/23   Mayer, Jodi R, NP  norethindrone  (MICRONOR ) 0.35 MG tablet Take 1 tablet (0.35 mg total) by mouth daily. Start taking at 4 weeks postpartum, around 01/22/19 11/29/18   Gene Lucie BROCKS, CNM  ondansetron  (ZOFRAN -ODT) 8 MG disintegrating tablet Take 1 tablet (8 mg total) by mouth every 8 (eight) hours as needed for nausea or vomiting. 01/31/22   Christopher Savannah, PA-C  pantoprazole  (PROTONIX ) 40 MG tablet Take 1 tablet (40 mg total) by mouth daily. 08/23/22   Mesner, Selinda, MD  pseudoephedrine  (SUDAFED) 60 MG tablet Take 1 tablet (60 mg total) by mouth every 8 (eight) hours as needed for congestion. 08/11/22   Christopher Savannah, PA-C  sucralfate  (CARAFATE ) 1 GM/10ML suspension Take 10 mLs (1 g total) by mouth 4 (four) times daily -  with meals and at bedtime. 08/23/22   Mesner, Selinda, MD    Allergies: Patient has no known allergies.    Review of Systems  Genitourinary:  Positive for pelvic pain.    Updated Vital Signs BP 139/87 (BP Location: Right Arm)   Pulse 84   Temp  98.5 F (36.9 C)   Resp 18   Ht 5' 4 (1.626 m)   Wt 122.5 kg   LMP 10/06/2023   SpO2 99%   BMI 46.35 kg/m   Physical Exam Vitals and nursing note reviewed.  Constitutional:      General: She is not in acute distress.    Appearance: She is well-developed. She is not diaphoretic.  HENT:     Head: Normocephalic and atraumatic.     Right Ear: External ear normal.     Left Ear: External ear normal.     Nose: Nose normal.     Mouth/Throat:     Mouth: Mucous membranes are moist.  Eyes:     General: No scleral icterus.    Conjunctiva/sclera: Conjunctivae normal.  Cardiovascular:     Rate and Rhythm: Normal rate and regular rhythm.     Heart sounds: Normal heart sounds.  No murmur heard.    No friction rub. No gallop.  Pulmonary:     Effort: Pulmonary effort is normal. No respiratory distress.     Breath sounds: Normal breath sounds.  Abdominal:     General: Bowel sounds are normal. There is no distension.     Palpations: Abdomen is soft. There is no mass.     Tenderness: There is no abdominal tenderness. There is no right CVA tenderness, left CVA tenderness or guarding.  Musculoskeletal:     Cervical back: Normal range of motion.  Skin:    General: Skin is warm and dry.  Neurological:     Mental Status: She is alert and oriented to person, place, and time.  Psychiatric:        Behavior: Behavior normal.     (all labs ordered are listed, but only abnormal results are displayed) Labs Reviewed  URINALYSIS, ROUTINE W REFLEX MICROSCOPIC    EKG: None  Radiology: No results found.   Procedures   Medications Ordered in the ED - No data to display  Clinical Course as of 10/20/23 1344  Fri Oct 20, 2023  1233 Review of EMR shows that her last urinalysis culture on 10/03/2023 showed 80,000 colony-forming units of lactobacillus. [AH]  1342 Nitrite(!): POSITIVE Patient took AZO standard yesterday [AH]    Clinical Course User Index [AH] Arloa Chroman, PA-C                                 Medical Decision Making Amount and/or Complexity of Data Reviewed Labs: ordered.   Patient here with complaint of left-sided pelvic pain last night that has essentially resolved along with urinary discomfort and vaginal discomfort which appear to be chronic issues for her. Differential diagnosis includes hemorrhagic ovarian cyst, ovarian torsion, tubo-ovarian abscess, renal stone, psoas abscess.  Patient has a very benign exam. I reviewed previous labs which showed 80,000 colony-forming units of lactobacillus on her last urine culture is not suggestive of urinary tract infection.  I discussed obtaining a catheterized specimen however patient declined.  She  does have nitrites on her urine today however she took Azo Standard and I think this is a false negative.  She has a few bacteria but no white blood cells no red blood cells.  Low suspicion for significant infection I have sent this for urine culture.  I ordered visualized and interpreted a pelvic x-ray which shows a physiologic corpus luteal cyst.  Patient potentially has mittelschmerz.  She does not appear to have any  other emergency causes of her pain such as ovarian torsion.  She may follow closely with her primary care physician or gynecologist and appears appropriate for discharge at this time     Final diagnoses:  None    ED Discharge Orders     None          Arloa Chroman, PA-C 10/20/23 1351    Neysa Caron PARAS, DO 10/20/23 1516

## 2023-10-21 LAB — URINE CULTURE: Culture: <10000 — AB

## 2023-11-03 NOTE — Therapy (Incomplete)
 OUTPATIENT PHYSICAL THERAPY THORACOLUMBAR EVALUATION   Patient Name: Hannah Campbell MRN: [id number] DOB:10-02-1995, 28 y.o., female Today's Date: 11/03/2023  END OF SESSION:   Past Medical History:  Diagnosis Date   Medical history non-contributory    Past Surgical History:  Procedure Laterality Date   NO PAST SURGERIES     Patient Active Problem List   Diagnosis Date Noted   Post term pregnancy over 40 weeks 11/26/2018   Dysmenorrhea 10/02/2017   Uses contraception 10/02/2017   Contraception management 10/06/2014    PCP: Celestia Harder, NP   REFERRING PROVIDER: Celestia Harder, NP   REFERRING DIAG: M54.50 (ICD-10-CM) - Low back pain, unspecified  THERAPY DIAG:  No diagnosis found.  RATIONALE FOR EVALUATION AND TREATMENT: Rehabilitation  ONSET DATE: 2020  NEXT MD VISIT: ***   SUBJECTIVE:                                                                                                                                                                                                         SUBJECTIVE STATEMENT: ***28 y/o referred to PT by PCP for back pain since 2020 when she was pregnant.  She is also having RLE pain.   Recently started new job with 8 hrs/day standing, lifting,walking.  She had lumbar xrays in the past that showed scoliosis per her latest PCP Note.    PAIN: Are you having pain? Yes: NPRS scale: *** Pain location: *** Pain description: *** Aggravating factors: *** Relieving factors: ***  PERTINENT HISTORY:  ***  PRECAUTIONS: {Therapy precautions:24002}  RED FLAGS: {PT Red Flags:29287}  WEIGHT BEARING RESTRICTIONS: {Yes ***/No:24003}  FALLS:  Has patient fallen in last 6 months? {fallsyesno:27318}  LIVING ENVIRONMENT: Lives with: {[facility] lives with:25569::lives with their family} Lives in: {Lives in:25570} Stairs: {opstairs:27293} Has following equipment at home: {Assistive devices:23999}  OCCUPATION: ***  PLOF: {PLOF:24004}  PATIENT  GOALS: ***   OBJECTIVE: (objective measures completed at initial evaluation unless otherwise dated)  DIAGNOSTIC FINDINGS:  ***  PATIENT SURVEYS:  {rehab surveys:24030}  SCREENING FOR RED FLAGS: Bowel or bladder incontinence: {Yes/No:304960894} Spinal tumors: {Yes/No:304960894} Cauda equina syndrome: {Yes/No:304960894} Compression fracture: {Yes/No:304960894} Abdominal aneurysm: {Yes/No:304960894}  COGNITION:  Overall cognitive status: {cognition:24006}    SENSATION: {sensation:27233}  POSTURE:  {posture:25561}  PALPATION: ***  LUMBAR ROM:   {AROM/PROM:27142}  Eval  Flexion   Extension   Right lateral flexion   Left lateral flexion   Right rotation   Left rotation   (Blank rows = not tested)  MUSCLE LENGTH: Hamstrings: Right *** deg; Left *** deg Thomas test: Right *** deg;  Left *** deg Hamstrings: *** ITB: *** Piriformis: *** Hip flexors: *** Quads: *** Heelcord: ***  LOWER EXTREMITY ROM:     {AROM/PROM:27142}  Right eval Left eval  Hip flexion    Hip extension    Hip abduction    Hip adduction    Hip internal rotation    Hip external rotation    Knee flexion    Knee extension    Ankle dorsiflexion    Ankle plantarflexion    Ankle inversion    Ankle eversion    (Blank rows = not tested)  LOWER EXTREMITY MMT:    MMT Right eval Left eval  Hip flexion    Hip extension    Hip abduction    Hip adduction    Hip internal rotation    Hip external rotation    Knee flexion    Knee extension    Ankle dorsiflexion    Ankle plantarflexion    Ankle inversion    Ankle eversion     (Blank rows = not tested)  LUMBAR SPECIAL TESTS:  {lumbar special test:25242}  FUNCTIONAL TESTS:  {Functional tests:24029}  GAIT: Distance walked: *** Assistive device utilized: {Assistive devices:23999} Level of assistance: {Levels of assistance:24026} Gait pattern: {gait characteristics:25376} Comments: ***   TODAY'S TREATMENT:   ***   PATIENT  EDUCATION:  Education details: {Education details:27468}  Person educated: {Person educated:25204} Education method: {Education Method [employer] Education comprehension: {Education Comprehension:25206}  HOME EXERCISE PROGRAM: ***   ASSESSMENT:  CLINICAL IMPRESSION: Hannah Campbell is a 28 y.o. female who was referred to physical therapy for evaluation and treatment for ***.  ***   Patient reports onset of *** pain beginning ***. Pain is worse with ***.  Patient has deficits in *** ROM, *** LE flexibility, *** strength, abnormal posture, and TTP with abnormal muscle tension *** which are interfering with ADLs and are impacting quality of life.  On Modified Oswestry patient scored ***/50 demonstrating ***% or *** disability.  Denette will benefit from skilled PT to address above deficits to improve mobility and activity tolerance with decreased pain interference.   ***  OBJECTIVE IMPAIRMENTS: {opptimpairments:25111}.   ACTIVITY LIMITATIONS: {activitylimitations:27494}  PARTICIPATION LIMITATIONS: {participationrestrictions:25113}  PERSONAL FACTORS: {Personal factors:25162} are also affecting patient's functional outcome.   REHAB POTENTIAL: {rehabpotential:25112}  CLINICAL DECISION MAKING: {clinical decision making:25114}  EVALUATION COMPLEXITY: {Evaluation complexity:25115}   GOALS: Goals reviewed with patient? {yes/no:20286}  SHORT TERM GOALS: Target date: ***  Patient will be independent with initial HEP to improve outcomes and carryover.  Baseline: *** Goal status: {GOALSTATUS:25110}  2.  Patient will report 25% improvement in low back pain to improve QOL. Baseline: *** Goal status: {GOALSTATUS:25110}  3.  *** Baseline:  Goal status: {GOALSTATUS:25110}   LONG TERM GOALS: Target date: ***  Patient will be independent with ongoing/advanced HEP for self-management at home.  Baseline: *** Goal status: {GOALSTATUS:25110}  2.  Patient will report 50-75% improvement in low  back pain to improve QOL.  Baseline: *** Goal status: {GOALSTATUS:25110}  3.  Patient to demonstrate ability to achieve and maintain good spinal alignment/posturing and body mechanics needed for daily activities. Baseline: *** Goal status: {GOALSTATUS:25110}  4.  Patient will demonstrate full pain free lumbar ROM to perform ADLs.   Baseline: Refer to above lumbar ROM table Goal status: {GOALSTATUS:25110}  5.  Patient will demonstrate improved *** strength to >/= ***/5 for improved stability and ease of mobility. Baseline: Refer to above LE MMT table Goal status: {GOALSTATUS:25110}  6. Patient will report </= ***%  on Modified Oswestry (MCID = 12%) to demonstrate improved functional ability with decreased pain interference. Baseline: *** Goal status: {GOALSTATUS:25110}  7.  Patient will tolerate *** min of (standing/sitting/walking) w/o increased pain to allow for *** improved mobility and activity tolerance. Baseline: *** Goal status: {GOALSTATUS:25110}  8.  Patient will report centralization of radicular symptoms.  Baseline: *** Goal status: {GOALSTATUS:25110}  9.  Patient will ***.  Baseline: *** Goal status: {GOALSTATUS:25110}   10.  *** Baseline: *** Goal status: {GOALSTATUS:25110}    PLAN:  PT FREQUENCY: {rehab frequency:25116}  PT DURATION: {rehab duration:25117}  PLANNED INTERVENTIONS: {rehab planned interventions:25118::97110-Therapeutic exercises,97530- Therapeutic [phone]- Neuromuscular re-education,97535- Self Rjmz,02859- Manual therapy}  PLAN FOR NEXT SESSION: PIERRETTE RED SENIOR, PT 11/03/2023, 5:21 PM

## 2023-11-07 ENCOUNTER — Ambulatory Visit: Attending: Nurse Practitioner | Admitting: Rehabilitation

## 2023-11-16 ENCOUNTER — Ambulatory Visit: Admitting: Urology

## 2023-11-16 NOTE — Progress Notes (Deleted)
   Assessment: 1. History of UTI     Plan: I personally reviewed the patient's chart including provider notes, lab results.   Chief Complaint: No chief complaint on file.   History of Present Illness:  Yuriana Gaal is a 28 y.o. female who is seen in consultation from Celestia Harder, NP for evaluation of UTI. Urine culture results: 3/25 <10K colonies 7/25 80K lactobacillus 7/25 <10K colonies  Past Medical History:  Past Medical History:  Diagnosis Date   Medical history non-contributory     Past Surgical History:  Past Surgical History:  Procedure Laterality Date   NO PAST SURGERIES      Allergies:  No Known Allergies  Family History:  Family History  Problem Relation Age of Onset   Diabetes Maternal Grandmother    Hypertension Maternal Grandmother     Social History:  Social History   Tobacco Use   Smoking status: Never   Smokeless tobacco: Never  Vaping Use   Vaping status: Never Used  Substance Use Topics   Alcohol use: Yes    Comment: occ   Drug use: No    Review of symptoms:  Constitutional:  Negative for unexplained weight loss, night sweats, fever, chills ENT:  Negative for nose bleeds, sinus pain, painful swallowing CV:  Negative for chest pain, shortness of breath, exercise intolerance, palpitations, loss of consciousness Resp:  Negative for cough, wheezing, shortness of breath GI:  Negative for nausea, vomiting, diarrhea, bloody stools GU:  Positives noted in HPI; otherwise negative for gross hematuria, dysuria, urinary incontinence Neuro:  Negative for seizures, poor balance, limb weakness, slurred speech Psych:  Negative for lack of energy, depression, anxiety Endocrine:  Negative for polydipsia, polyuria, symptoms of hypoglycemia (dizziness, hunger, sweating) Hematologic:  Negative for anemia, purpura, petechia, prolonged or excessive bleeding, use of anticoagulants  Allergic:  Negative for difficulty breathing or choking as a result of  exposure to anything; no shellfish allergy; no allergic response (rash/itch) to materials, foods  Physical exam: LMP 10/06/2023  GENERAL APPEARANCE:  Well appearing, well developed, well nourished, NAD HEENT: Atraumatic, Normocephalic, oropharynx clear. NECK: Supple without lymphadenopathy or thyromegaly. LUNGS: Clear to auscultation bilaterally. HEART: Regular Rate and Rhythm without murmurs, gallops, or rubs. ABDOMEN: Soft, non-tender, No Masses. EXTREMITIES: Moves all extremities well.  Without clubbing, cyanosis, or edema. NEUROLOGIC:  Alert and oriented x 3, normal gait, CN II-XII grossly intact.  MENTAL STATUS:  Appropriate. BACK:  Non-tender to palpation.  No CVAT SKIN:  Warm, dry and intact.    Results: U/A:

## 2023-12-10 ENCOUNTER — Ambulatory Visit
Admission: EM | Admit: 2023-12-10 | Discharge: 2023-12-10 | Disposition: A | Discharge status: Home / Self Care | Attending: Family Medicine | Admitting: Family Medicine

## 2023-12-10 DIAGNOSIS — Z1152 Encounter for screening for COVID-19: Secondary | ICD-10-CM

## 2023-12-10 DIAGNOSIS — J988 Other specified respiratory disorders: Secondary | ICD-10-CM

## 2023-12-10 DIAGNOSIS — B9789 Other viral agents as the cause of diseases classified elsewhere: Secondary | ICD-10-CM

## 2023-12-10 LAB — POC SOFIA SARS ANTIGEN FIA: SARS Coronavirus 2 Ag: NEGATIVE

## 2023-12-10 MED ORDER — PROMETHAZINE-DM 6.25-15 MG/5ML PO SYRP: 5.0000 mL | ORAL_SOLUTION | Freq: Three times a day (TID) | ORAL | 0 refills | Status: AC | PRN

## 2023-12-10 MED ORDER — PSEUDOEPHEDRINE HCL 60 MG PO TABS: 60.0000 mg | ORAL_TABLET | Freq: Three times a day (TID) | ORAL | 0 refills | Status: AC | PRN

## 2023-12-10 MED ORDER — IBUPROFEN 600 MG PO TABS: 600.0000 mg | ORAL_TABLET | Freq: Four times a day (QID) | ORAL | 0 refills | Status: AC | PRN

## 2023-12-10 MED ORDER — CETIRIZINE HCL 10 MG PO TABS: 10.0000 mg | ORAL_TABLET | Freq: Every day | ORAL | 0 refills | Status: AC

## 2023-12-10 NOTE — ED Provider Notes (Signed)
 [facility]  Note:  This document was prepared using Conservation officer, historic buildings and may include unintentional dictation errors.  MRN: [id number] DOB: 03/05/1996  Subjective:   Hannah Campbell is a 28 y.o. female presenting for 1 day history of malaise, fatigue, headaches, chest heaviness, body pains.  Has used Tylenol .  Had exposure to COVID-19 from one of her coworkers.  No shortness of breath, wheezing.  No asthma.  Has allergies.  No smoking of any kind including cigarettes, cigars, vaping, marijuana use.    No current facility-administered medications for this encounter.  Current Outpatient Medications:    acetaminophen  (TYLENOL ) 325 MG tablet, Take 2 tablets (650 mg total) by mouth every 6 (six) hours as needed (for pain scale < 4)., Disp: 60 tablet, Rfl: 1   famotidine  (PEPCID ) 20 MG tablet, Take 1 tablet (20 mg total) by mouth 2 (two) times daily for 10 days., Disp: 20 tablet, Rfl: 0   fluticasone  (FLONASE ) 50 MCG/ACT nasal spray, Place 1 spray into both nostrils daily. Begin by using 2 sprays in each nare daily for 3 to 5 days, then decrease to 1 spray in each nare daily., Disp: 15.8 mL, Rfl: 2   naproxen  (NAPROSYN ) 375 MG tablet, Take 1 tablet (375 mg total) by mouth 2 (two) times daily with a meal., Disp: 30 tablet, Rfl: 0   nitrofurantoin , macrocrystal-monohydrate, (MACROBID ) 100 MG capsule, Take 1 capsule (100 mg total) by mouth 2 (two) times daily., Disp: 10 capsule, Rfl: 0   norethindrone  (MICRONOR ) 0.35 MG tablet, Take 1 tablet (0.35 mg total) by mouth daily. Start taking at 4 weeks postpartum, around 01/22/19, Disp: 1 Package, Rfl: 11   ondansetron  (ZOFRAN -ODT) 8 MG disintegrating tablet, Take 1 tablet (8 mg total) by mouth every 8 (eight) hours as needed for nausea or vomiting., Disp: 20 tablet, Rfl: 0   pantoprazole  (PROTONIX ) 40 MG tablet, Take 1 tablet (40 mg total) by mouth daily., Disp: 14 tablet, Rfl: 0   pseudoephedrine  (SUDAFED) 60 MG tablet,  Take 1 tablet (60 mg total) by mouth every 8 (eight) hours as needed for congestion., Disp: 30 tablet, Rfl: 0   sucralfate  (CARAFATE ) 1 GM/10ML suspension, Take 10 mLs (1 g total) by mouth 4 (four) times daily -  with meals and at bedtime., Disp: 420 mL, Rfl: 0   No Known Allergies  Past Medical History:  Diagnosis Date   Medical history non-contributory      Past Surgical History:  Procedure Laterality Date   NO PAST SURGERIES      Family History  Problem Relation Age of Onset   Diabetes Maternal Grandmother    Hypertension Maternal Grandmother     Social History   Tobacco Use   Smoking status: Never   Smokeless tobacco: Never  Vaping Use   Vaping status: Never Used  Substance Use Topics   Alcohol use: Yes    Comment: occ   Drug use: No    ROS   Objective:   Vitals: BP 123/84 (BP Location: Left Arm)   Pulse 74   Temp 98.3 F (36.8 C) (Oral)   Resp 20   LMP 12/05/2023 (Exact Date)   SpO2 98%   Physical Exam Constitutional:      General: She is not in acute distress.    Appearance: Normal appearance. She is well-developed and normal weight. She is not ill-appearing, toxic-appearing or diaphoretic.  HENT:     Head: Normocephalic and atraumatic.     Right Ear:  Tympanic membrane, ear canal and external ear normal. No drainage or tenderness. No middle ear effusion. There is no impacted cerumen. Tympanic membrane is not erythematous or bulging.     Left Ear: Tympanic membrane, ear canal and external ear normal. No drainage or tenderness.  No middle ear effusion. There is no impacted cerumen. Tympanic membrane is not erythematous or bulging.     Nose: Nose normal. No congestion or rhinorrhea.     Mouth/Throat:     Mouth: Mucous membranes are moist. No oral lesions.     Pharynx: No pharyngeal swelling, oropharyngeal exudate, posterior oropharyngeal erythema or uvula swelling.     Tonsils: No tonsillar exudate or tonsillar abscesses. 0 on the right. 0 on the left.   Eyes:     General: No scleral icterus.       Right eye: No discharge.        Left eye: No discharge.     Extraocular Movements: Extraocular movements intact.     Right eye: Normal extraocular motion.     Left eye: Normal extraocular motion.     Conjunctiva/sclera: Conjunctivae normal.  Cardiovascular:     Rate and Rhythm: Normal rate and regular rhythm.     Heart sounds: Normal heart sounds. No murmur heard.    No friction rub. No gallop.  Pulmonary:     Effort: Pulmonary effort is normal. No respiratory distress.     Breath sounds: No stridor. No wheezing, rhonchi or rales.  Chest:     Chest wall: No tenderness.  Musculoskeletal:     Cervical back: Normal range of motion and neck supple.  Lymphadenopathy:     Cervical: No cervical adenopathy.  Skin:    General: Skin is warm and dry.  Neurological:     General: No focal deficit present.     Mental Status: She is alert and oriented to person, place, and time.     Cranial Nerves: No cranial nerve deficit.     Motor: No weakness.     Coordination: Coordination normal.     Gait: Gait normal.  Psychiatric:        Mood and Affect: Mood normal.        Behavior: Behavior normal.     Results for orders placed or performed during the hospital encounter of 12/10/23 (from the past 24 hours)  POC SARS Coronavirus 2 Ag     Status: Normal   Collection Time: 12/10/23  3:27 PM  Result Value Ref Range   SARS Coronavirus 2 Ag Negative Negative    Assessment and Plan :   PDMP not reviewed this encounter.  1. Encounter for screening for COVID-19   2. Viral respiratory illness    Deferred imaging given clear cardiopulmonary exam, hemodynamically stable vital signs. Suspect viral URI, viral syndrome. Physical exam findings reassuring and vital signs stable for discharge. Advised supportive care, offered symptomatic relief. Counseled patient on potential for adverse effects with medications prescribed/recommended today, ER and  return-to-clinic precautions discussed, patient verbalized understanding.     Christopher Savannah, PA-C 12/10/23 [id number]

## 2023-12-10 NOTE — ED Triage Notes (Signed)
 Pt requested COVID test. Pt reports headache and body aches x 1 day.Tylenol  gives no relief. Pt reports heaviness in the chest x 1 day, omeprazole gives relief.  States one of her coworkers has COVID.

## 2023-12-10 NOTE — Discharge Instructions (Signed)
For sore throat or cough try using a honey-based tea. Use 3 teaspoons of honey with juice squeezed from half lemon. Place shaved pieces of ginger into 1/2-1 cup of water and warm over stove top. Then mix the ingredients and repeat every 4 hours as needed. Please take ibuprofen 600mg  every 6 hours with food alternating with OR taken together with Tylenol 500mg -650mg  every 6 hours for throat pain, fevers, aches and pains. Hydrate very well with at least 2 liters of water. Eat light meals such as soups (chicken and noodles, vegetable, chicken and wild rice).  Do not eat foods that you are allergic to.  Taking an antihistamine like Zyrtec (10mg  daily) can help against postnasal drainage, sinus congestion which can cause sinus pain, sinus headaches, throat pain, painful swallowing, coughing.  You can take this together with pseudoephedrine (Sudafed) at a dose of 60 mg 3 times a day or twice daily as needed for the same kind of nasal drip, congestion.

## 2024-03-11 ENCOUNTER — Ambulatory Visit
Admission: EM | Admit: 2024-03-11 | Discharge: 2024-03-11 | Disposition: A | Discharge status: Home / Self Care | Source: Home / Self Care

## 2024-03-11 ENCOUNTER — Ambulatory Visit (INDEPENDENT_AMBULATORY_CARE_PROVIDER_SITE_OTHER)

## 2024-03-11 DIAGNOSIS — R09A2 Foreign body sensation, throat: Secondary | ICD-10-CM

## 2024-03-11 NOTE — ED Provider Notes (Signed)
 [facility]  Note:  This document was prepared using Conservation officer, historic buildings and may include unintentional dictation errors.  MRN: [id number] DOB: Dec 13, 1995  Subjective:   Hannah Campbell is a 28 y.o. female presenting for foreign body sensation of her throat since lunchtime today.  Believes it was a piece of apple.  No difficulty with her breathing.  Is hesitant to eat or drink anything else out of concern that she has foreign body in her throat.  Would like imaging.  Current Outpatient Medications  Medication Instructions   acetaminophen  (TYLENOL ) 650 mg, Oral, Every 6 hours PRN   cetirizine  (ZYRTEC  ALLERGY) 10 mg, Oral, Daily   famotidine  (PEPCID ) 20 mg, Oral, 2 times daily   fluticasone  (FLONASE ) 50 MCG/ACT nasal spray 1 spray, Each Nare, Daily, Begin by using 2 sprays in each nare daily for 3 to 5 days, then decrease to 1 spray in each nare daily.   ibuprofen  (ADVIL ) 600 mg, Oral, Every 6 hours PRN   naproxen  (NAPROSYN ) 375 mg, Oral, 2 times daily with meals   nitrofurantoin  (macrocrystal-monohydrate) (MACROBID ) 100 mg, Oral, 2 times daily   norethindrone  (MICRONOR ) 0.35 mg, Oral, Daily, Start taking at 4 weeks postpartum, around 01/22/19   omeprazole (PRILOSEC) 40 mg, Oral   ondansetron  (ZOFRAN -ODT) 8 mg, Oral, Every 8 hours PRN   pantoprazole  (PROTONIX ) 40 mg, Oral, Daily   promethazine -dextromethorphan (PROMETHAZINE -DM) 6.25-15 MG/5ML syrup 5 mLs, Oral, 3 times daily PRN   pseudoephedrine  (SUDAFED) 60 mg, Oral, Every 8 hours PRN   sucralfate  (CARAFATE ) 1 g, Oral, 3 times daily with meals & bedtime    Allergies[1]  Past Medical History:  Diagnosis Date   GERD (gastroesophageal reflux disease)    per pt   Medical history non-contributory    Vaginal yeast infection    per pt     Past Surgical History:  Procedure Laterality Date   NO PAST SURGERIES      Family History  Problem Relation Age of Onset   Diabetes Maternal Grandmother     Hypertension Maternal Grandmother     Social History   Occupational History   Not on file  Tobacco Use   Smoking status: Never   Smokeless tobacco: Never  Vaping Use   Vaping status: Never Used  Substance and Sexual Activity   Alcohol use: Yes    Comment: occ   Drug use: No   Sexual activity: Not Currently    Birth control/protection: None     ROS   Objective:   Vitals: BP (!) 120/58 (BP Location: Right Arm)   Pulse 80   Temp 98.4 F (36.9 C) (Oral)   Resp 16   LMP 03/10/2024   SpO2 98%   Physical Exam Constitutional:      General: She is not in acute distress.    Appearance: Normal appearance. She is well-developed. She is not ill-appearing, toxic-appearing or diaphoretic.  HENT:     Head: Normocephalic and atraumatic.     Nose: Nose normal.     Mouth/Throat:     Mouth: Mucous membranes are moist.     Pharynx: No pharyngeal swelling, oropharyngeal exudate, posterior oropharyngeal erythema or uvula swelling.     Tonsils: No tonsillar exudate or tonsillar abscesses. 0 on the right. 0 on the left.  Eyes:     General: No scleral icterus.       Right eye: No discharge.        Left eye: No discharge.  Extraocular Movements: Extraocular movements intact.  Neck:     Trachea: Trachea and phonation normal. No tracheal tenderness, abnormal tracheal secretions or tracheal deviation.  Cardiovascular:     Rate and Rhythm: Normal rate and regular rhythm.     Heart sounds: Normal heart sounds. No murmur heard.    No friction rub. No gallop.  Pulmonary:     Effort: Pulmonary effort is normal. No tachypnea, accessory muscle usage, respiratory distress or retractions.     Breath sounds: Normal breath sounds. No stridor, decreased air movement or transmitted upper airway sounds. No decreased breath sounds, wheezing, rhonchi or rales.     Comments: No stridor. Chest:     Chest wall: No tenderness.  Skin:    General: Skin is warm and dry.  Neurological:     General: No  focal deficit present.     Mental Status: She is alert and oriented to person, place, and time.  Psychiatric:        Mood and Affect: Mood normal.        Behavior: Behavior normal.    DG Neck Soft Tissue Result Date: 03/11/2024 EXAM: 1 VIEW(S) Xray of the Soft Tissue Neck 03/11/2024 06:59:31 PM COMPARISON: None available. CLINICAL HISTORY: Throat pain FINDINGS: SOFT TISSUES: Prevertebral soft tissue are unremarkable. EPIGLOTTIS: No epiglottic thickening. BONES: No acute abnormality. IMPRESSION: 1. No significant abnormality. Electronically signed by: Greig Pique MD 03/11/2024 07:18 PM EST RP Workstation: [id number]   Assessment and Plan :   PDMP not reviewed this encounter.  1. Foreign body sensation in throat    Recommended conservative management.  X-ray and physical exam findings reassuring.    [1] No Known Allergies    Christopher Savannah, PA-C 03/11/24 1949

## 2024-03-11 NOTE — ED Triage Notes (Signed)
 Pt states she feels she has apple stuck in throat since ~3p-pt able to swallow own saliva-NAD-steady gait
